# Patient Record
Sex: Male | Born: 1985 | Race: Black or African American | Hispanic: No | Marital: Single | Smoking: Never smoker
Health system: Southern US, Community
[De-identification: ages and names within clinical notes are randomized; demographics above are authoritative.]

## PROBLEM LIST (undated history)

## (undated) DIAGNOSIS — J45909 Unspecified asthma, uncomplicated: Secondary | ICD-10-CM

## (undated) DIAGNOSIS — F1010 Alcohol abuse, uncomplicated: Secondary | ICD-10-CM

## (undated) DIAGNOSIS — M109 Gout, unspecified: Secondary | ICD-10-CM

---
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
## 2005-08-25 ENCOUNTER — Emergency Department (HOSPITAL_COMMUNITY): Admission: EM | Admit: 2005-08-25 | Discharge: 2005-08-25 | Payer: Self-pay | Admitting: Emergency Medicine
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
## 2009-07-26 ENCOUNTER — Emergency Department (HOSPITAL_COMMUNITY): Admission: EM | Admit: 2009-07-26 | Discharge: 2009-07-26 | Payer: Self-pay | Admitting: Emergency Medicine

## 2009-08-03 ENCOUNTER — Emergency Department (HOSPITAL_COMMUNITY): Admission: EM | Admit: 2009-08-03 | Discharge: 2009-08-03 | Payer: Self-pay | Admitting: Emergency Medicine

## 2010-05-18 ENCOUNTER — Emergency Department (HOSPITAL_COMMUNITY): Admission: EM | Admit: 2010-05-18 | Discharge: 2010-05-18 | Payer: Self-pay | Admitting: Emergency Medicine

## 2010-12-11 LAB — HERPES SIMPLEX VIRUS CULTURE: Culture: DETECTED

## 2013-12-09 ENCOUNTER — Encounter (HOSPITAL_COMMUNITY): Payer: Self-pay | Admitting: Emergency Medicine

## 2013-12-09 ENCOUNTER — Emergency Department (HOSPITAL_COMMUNITY)
Admission: EM | Admit: 2013-12-09 | Discharge: 2013-12-09 | Disposition: A | Discharge status: Home / Self Care | Payer: Self-pay | Attending: Emergency Medicine | Admitting: Emergency Medicine

## 2013-12-09 DIAGNOSIS — S61209A Unspecified open wound of unspecified finger without damage to nail, initial encounter: Secondary | ICD-10-CM | POA: Insufficient documentation

## 2013-12-09 DIAGNOSIS — F1010 Alcohol abuse, uncomplicated: Secondary | ICD-10-CM | POA: Insufficient documentation

## 2013-12-09 DIAGNOSIS — S61219A Laceration without foreign body of unspecified finger without damage to nail, initial encounter: Secondary | ICD-10-CM

## 2013-12-09 DIAGNOSIS — Y9389 Activity, other specified: Secondary | ICD-10-CM | POA: Insufficient documentation

## 2013-12-09 DIAGNOSIS — Y92410 Unspecified street and highway as the place of occurrence of the external cause: Secondary | ICD-10-CM | POA: Insufficient documentation

## 2013-12-09 DIAGNOSIS — Z23 Encounter for immunization: Secondary | ICD-10-CM | POA: Insufficient documentation

## 2013-12-09 HISTORY — DX: Alcohol abuse, uncomplicated: F10.10

## 2013-12-09 MED ORDER — TETANUS-DIPHTH-ACELL PERTUSSIS 5-2.5-18.5 LF-MCG/0.5 IM SUSP
0.5000 mL | Freq: Once | INTRAMUSCULAR | Status: AC
  Administered 2013-12-09: 0.5 mL via INTRAMUSCULAR
  Filled 2013-12-09: qty 0.5
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
## 2016-06-14 ENCOUNTER — Emergency Department (HOSPITAL_COMMUNITY)
Admission: EM | Admit: 2016-06-14 | Discharge: 2016-06-14 | Disposition: A | Discharge status: Home / Self Care | Payer: Self-pay | Attending: Emergency Medicine | Admitting: Emergency Medicine

## 2016-06-14 ENCOUNTER — Emergency Department (HOSPITAL_COMMUNITY): Payer: Self-pay

## 2016-06-14 ENCOUNTER — Encounter (HOSPITAL_COMMUNITY): Payer: Self-pay

## 2016-06-14 DIAGNOSIS — F1290 Cannabis use, unspecified, uncomplicated: Secondary | ICD-10-CM | POA: Insufficient documentation

## 2016-06-14 DIAGNOSIS — M7651 Patellar tendinitis, right knee: Secondary | ICD-10-CM | POA: Insufficient documentation

## 2016-06-14 DIAGNOSIS — F1490 Cocaine use, unspecified, uncomplicated: Secondary | ICD-10-CM | POA: Insufficient documentation

## 2016-06-14 NOTE — ED Triage Notes (Signed)
Pt here with rt knee pain.  Pt denies injury.  Pain x 2 days.

## 2016-06-14 NOTE — ED Provider Notes (Signed)
[facility]-EMERGENCY DEPT Provider Note   CSN: [id number] Arrival date & time: 06/14/16  1413   By signing my name below, I, Avnee Patel, attest that this documentation has been prepared under the direction and in the presence of  [employer], PA-C. Electronically Signed: Clovis Pu, ED Scribe. 06/14/16. 5:05 PM.   History   Chief Complaint Chief Complaint  Patient presents with  . Knee Pain    The history is provided by the patient. No language interpreter was used.   HPI Comments: ROY TOKARZ is a 30 y.o. male, with a hx of Osgood-Schlatter disease, who presents to the Emergency Department complaining of constant, moderate right knee pain onset 2 days ago. Pt notes associated swelling which has been present for ~1 week. He states the pain is exacerbated to the touch and during movement. He notes he has 2 jobs and is always on his feet. Pt denies any recent injuries. Pt states he has had similar symptoms in the past. He has applied Bengay, ice, and tried elevating his leg with no relief. Pt denies any other complaints at this time.  Past Medical History:  Diagnosis Date  . ETOH abuse     There are no active problems to display for this patient.   History reviewed. No pertinent surgical history.   Home Medications    Prior to Admission medications   Not on File    Family History History reviewed. No pertinent family history.  Social History Social History  Substance Use Topics  . Smoking status: Never Smoker  . Smokeless tobacco: Never Used  . Alcohol use 4.2 oz/week    5 Cans of beer, 2 Shots of liquor per week     Allergies   Review of patient's allergies indicates no known allergies.   Review of Systems Review of Systems 10 systems reviewed and all are negative for acute change except as noted in the HPI.  Physical Exam Updated Vital Signs BP 141/83 (BP Location: Left Arm)   Pulse 61   Temp 98.6 F (37 C) (Oral)   Resp 18   SpO2 100%    Physical Exam  Constitutional: He appears well-developed and well-nourished. No distress.  HENT:  Head: Normocephalic and atraumatic.  Neck: Neck supple.  Pulmonary/Chest: Effort normal.  Musculoskeletal:  Moderate amount of anterior knee swelling, no redness or warmth to touch. Tenderness to palpation of the inferior patellar ligament and tibial tuberosity full active range of motion.  Neurological: He is alert.  Skin: He is not diaphoretic.  Nursing note and vitals reviewed.    ED Treatments / Results  DIAGNOSTIC STUDIES:  Oxygen Saturation is 100% on RA, normal by my interpretation.    COORDINATION OF CARE:  4:44 PM Discussed treatment plan with pt at bedside and pt agreed to plan.  Labs (all labs ordered are listed, but only abnormal results are displayed) Labs Reviewed - No data to display  EKG  EKG Interpretation None       Radiology Dg Knee Complete 4 Views Right  Result Date: 06/14/2016 CLINICAL DATA:  Right knee pain for 3 days, no known injury, initial encounter EXAM: RIGHT KNEE - COMPLETE 4+ VIEW COMPARISON:  None FINDINGS: No acute fracture or dislocation is noted. Some thickening of the infrapatellar ligament is seen which may be related to chronic injury. Increased density in the infrapatellar fat pad is also noted which may be related to some internal derangement. IMPRESSION: No acute fracture. There are changes suggestive of prior injury  with thickening of the infrapatellar ligament and increased density in the infrapatellar fat pad. Electronically Signed   By: Alcide CleverMark  Lukens M.D.   On: 06/14/2016 15:55    Procedures Procedures (including critical care time)  Medications Ordered in ED Medications - No data to display   Initial Impression / Assessment and Plan / ED Course  I have reviewed the triage vital signs and the nursing notes.  Pertinent labs & imaging results that were available during my care of the patient were reviewed by me and considered  in my medical decision making (see chart for details).  Clinical Course    Labs:   Imaging:   Consults:   Therapeutics:   Discharge Meds:  Assessment/Plan:  30 year old male presents today with likely patellar tendinitis. He has no signs of infectious etiology on exam. Full active range of motion. Patient works 2 jobs likely attributed today's presentation. He was instructed to rest, ice, ibuprofen, follow up with orthopedics if symptoms persist.   Final Clinical Impressions(s) / ED Diagnoses   Final diagnoses:  Patellar tendinitis of right knee    New Prescriptions There are no discharge medications for this patient. I personally performed the services described in this documentation, which was scribed in my presence. The recorded information has been reviewed and is accurate.   Eyvonne MechanicJeffrey Cobie Marcoux, PA-C 06/14/16 1727    Laurence Spatesachel Morgan Little, MD 06/15/16 [id number]

## 2016-06-14 NOTE — Discharge Instructions (Signed)
Please use ice, ibuprofen, rest. Please return to emergency room if any new or worsening signs or symptoms present. Please follow-up with orthopedic specialist if symptoms continue to persist.

## 2017-01-17 ENCOUNTER — Emergency Department (HOSPITAL_COMMUNITY)
Admission: EM | Admit: 2017-01-17 | Discharge: 2017-01-17 | Disposition: A | Discharge status: Home / Self Care | Payer: Self-pay | Attending: Emergency Medicine | Admitting: Emergency Medicine

## 2017-01-17 ENCOUNTER — Encounter (HOSPITAL_COMMUNITY): Payer: Self-pay | Admitting: *Deleted

## 2017-01-17 ENCOUNTER — Emergency Department (HOSPITAL_COMMUNITY): Payer: Self-pay

## 2017-01-17 DIAGNOSIS — S63502A Unspecified sprain of left wrist, initial encounter: Secondary | ICD-10-CM | POA: Insufficient documentation

## 2017-01-17 DIAGNOSIS — X509XXA Other and unspecified overexertion or strenuous movements or postures, initial encounter: Secondary | ICD-10-CM | POA: Insufficient documentation

## 2017-01-17 DIAGNOSIS — Y9289 Other specified places as the place of occurrence of the external cause: Secondary | ICD-10-CM | POA: Insufficient documentation

## 2017-01-17 DIAGNOSIS — Y9389 Activity, other specified: Secondary | ICD-10-CM | POA: Insufficient documentation

## 2017-01-17 DIAGNOSIS — Y990 Civilian activity done for income or pay: Secondary | ICD-10-CM | POA: Insufficient documentation

## 2017-01-17 MED ORDER — NAPROXEN 500 MG PO TABS: 500.0000 mg | ORAL_TABLET | Freq: Two times a day (BID) | ORAL | 0 refills | Status: DC

## 2017-01-17 NOTE — ED Notes (Signed)
See md assessment 

## 2017-01-17 NOTE — ED Triage Notes (Signed)
Pt states that he has been having left wrist pain for 3 days. Pt unsure of any injury. Reports minimal swelling.
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
## 2018-09-05 ENCOUNTER — Other Ambulatory Visit: Payer: Self-pay

## 2018-09-05 ENCOUNTER — Ambulatory Visit (HOSPITAL_COMMUNITY)
Admission: EM | Admit: 2018-09-05 | Discharge: 2018-09-05 | Disposition: A | Discharge status: Home / Self Care | Payer: 59 | Attending: Family Medicine | Admitting: Family Medicine

## 2018-09-05 ENCOUNTER — Encounter (HOSPITAL_COMMUNITY): Payer: Self-pay | Admitting: Emergency Medicine

## 2018-09-05 DIAGNOSIS — R69 Illness, unspecified: Secondary | ICD-10-CM | POA: Diagnosis not present

## 2018-09-05 DIAGNOSIS — J111 Influenza due to unidentified influenza virus with other respiratory manifestations: Secondary | ICD-10-CM | POA: Diagnosis not present

## 2018-09-05 DIAGNOSIS — R509 Fever, unspecified: Secondary | ICD-10-CM | POA: Diagnosis present

## 2018-09-05 LAB — POCT RAPID STREP A: STREPTOCOCCUS, GROUP A SCREEN (DIRECT): NEGATIVE

## 2018-09-05 MED ORDER — ACETAMINOPHEN 325 MG PO TABS
ORAL_TABLET | ORAL | Status: AC
Start: 2018-09-05 — End: 2018-09-05
  Filled 2018-09-05: qty 2

## 2018-09-05 MED ORDER — NAPROXEN 500 MG PO TABS: 500.0000 mg | ORAL_TABLET | Freq: Two times a day (BID) | ORAL | 0 refills | Status: DC

## 2018-09-05 MED ORDER — ACETAMINOPHEN 325 MG PO TABS
650.0000 mg | ORAL_TABLET | Freq: Once | ORAL | Status: AC
  Administered 2018-09-05: 650 mg via ORAL

## 2018-09-05 NOTE — ED Provider Notes (Signed)
09/05/2018 3:32 PM   DOB: [birth year] / MRN: [id number]  SUBJECTIVE:  John Meadows is a 32 y.o. male presenting for fever body aches that started about 2 days ago ago.  Assoicates cough and headache.  Denies shortness of breath, chest pain, leg swelling.  Has tried over-the-counter TheraFlu.   He has No Known Allergies.   He  has a past medical history of ETOH abuse.    He  reports that he has never smoked. He has never used smokeless tobacco. He reports that he drinks about 7.0 standard drinks of alcohol per week. He reports that he has current or past drug history. Drugs: Marijuana and Cocaine. He  has no sexual activity history on file. The patient  has no past surgical history on file.  His family history is not on file.  ROS per HPI  OBJECTIVE:  BP (!) 140/92   Pulse (!) 106   Temp (!) 102 F (38.9 C) (Oral)   Resp 16   Wt 200 lb (90.7 kg)   SpO2 98%   Wt Readings from Last 3 Encounters:  09/05/18 200 lb (90.7 kg)   Temp Readings from Last 3 Encounters:  09/05/18 (!) 102 F (38.9 C) (Oral)  01/17/17 98.4 F (36.9 C) (Oral)  06/14/16 98.6 F (37 C) (Oral)   BP Readings from Last 3 Encounters:  09/05/18 (!) 140/92  01/17/17 (!) 139/105  06/14/16 141/83   Pulse Readings from Last 3 Encounters:  09/05/18 (!) 106  01/17/17 62  06/14/16 61    Physical Exam  Constitutional: He is oriented to person, place, and time. He appears well-developed.  Non-toxic appearance. He does not appear ill.  Eyes: Pupils are equal, round, and reactive to light. Conjunctivae and EOM are normal.  Cardiovascular: Regular rhythm, S1 normal, S2 normal, normal heart sounds, intact distal pulses and normal pulses. Exam reveals no gallop and no friction rub.  No murmur heard. Pulmonary/Chest: Effort normal. No stridor. No respiratory distress. He has no wheezes. He has no rales.  Abdominal: He exhibits no distension.  Musculoskeletal: Normal range of motion. He exhibits no edema.   Neurological: He is alert and oriented to person, place, and time. No cranial nerve deficit. Coordination normal.  Skin: Skin is warm. He is diaphoretic.  Psychiatric: He has a normal mood and affect.  Nursing note and vitals reviewed.   Results for orders placed or performed during the hospital encounter of 09/05/18 (from the past 72 hour(s))  POCT rapid strep A [facility]     Status: None   Collection Time: 09/05/18  3:21 PM  Result Value Ref Range   Streptococcus, Group A Screen (Direct) NEGATIVE NEGATIVE    No results found.  ASSESSMENT AND PLAN:   Influenza-like illness    Discharge Instructions     Go home and get plenty of rest drink lots of fluids.  Please take the NSAID as I prescribed.  Please keep away from small children in the elderly until you are fever free for 48 hours.        The patient is advised to call or return to clinic if he does not see an improvement in symptoms, or to seek the care of the closest emergency department if he worsens with the above plan.   Deliah BostonMichael Clark, MHS, PA-C 09/05/2018 3:32 PM   Ofilia Neaslark, Michael L, PA-C 09/05/18 [phone]

## 2018-09-05 NOTE — ED Triage Notes (Signed)
PT reports cough, sweats, congestion, sore throat, and fever that started a few days ago. PT's temp 102 in triage.

## 2018-09-05 NOTE — Discharge Instructions (Signed)
Go home and get plenty of rest drink lots of fluids.  Please take the NSAID as I prescribed.  Please keep away from small children in the elderly until you are fever free for 48 hours.

## 2018-09-08 LAB — CULTURE, GROUP A STREP (THRC)

## 2019-02-21 ENCOUNTER — Other Ambulatory Visit: Payer: Self-pay

## 2019-02-21 ENCOUNTER — Encounter (HOSPITAL_COMMUNITY): Payer: Self-pay | Admitting: Emergency Medicine

## 2019-02-21 ENCOUNTER — Ambulatory Visit (HOSPITAL_COMMUNITY)
Admission: EM | Admit: 2019-02-21 | Discharge: 2019-02-21 | Disposition: A | Discharge status: Home / Self Care | Payer: 59 | Attending: Internal Medicine | Admitting: Internal Medicine

## 2019-02-21 DIAGNOSIS — R0789 Other chest pain: Secondary | ICD-10-CM

## 2019-02-21 MED ORDER — MELOXICAM 7.5 MG PO TABS: 7.5000 mg | ORAL_TABLET | Freq: Every day | ORAL | 0 refills | Status: DC

## 2019-02-21 NOTE — ED Provider Notes (Signed)
[facility]    CSN: [id number] Arrival date & time: 02/21/19  0803     History   Chief Complaint Chief Complaint  Patient presents with  . Muscle Pain    HPI John Meadows is a 33 y.o. male.   John Meadows presents with complaints of left sided chest wall pain, which started 1 week ago. States approximately 1.5 months ago he started doing push ups regularly and has been pushing himself with this. Also lifts 70-80 pounds at work. No specific known injury but had been doing push ups and lifting prior to onset of pain. Pain with any movement of chest wall, even with coughing or deep breathing. Pain with push-ups so hasn't been able to do those anymore. Pain with palpation to the area as well. No left arm numbness , tingling or weakness. No shortness of breath . No palpitations. No fevers. No cough. Denies any previous similar. Has been taking aleve which helps but he has not been taking it consistently. Hasn't taken today. Pain 10/10, hasn't taken any medications today. Without contributing medical history.      ROS per HPI, negative if not otherwise mentioned.      Past Medical History:  Diagnosis Date  . ETOH abuse     There are no active problems to display for this patient.   History reviewed. No pertinent surgical history.     Home Medications    Prior to Admission medications   Medication Sig Start Date End Date Taking? Authorizing Provider  meloxicam (MOBIC) 7.5 MG tablet Take 1 tablet (7.5 mg total) by mouth daily. 02/21/19   Georgetta HaberBurky, Natalie B, NP    Family History History reviewed. No pertinent family history.  Social History Social History   Tobacco Use  . Smoking status: Never Smoker  . Smokeless tobacco: Never Used  Substance Use Topics  . Alcohol use: Yes    Alcohol/week: 7.0 standard drinks    Types: 5 Cans of beer, 2 Shots of liquor per week  . Drug use: Yes    Types: Marijuana, Cocaine     Allergies   Patient has no  known allergies.   Review of Systems Review of Systems   Physical Exam Triage Vital Signs ED Triage Vitals  Enc Vitals Group     BP 02/21/19 0822 (!) 150/99     Pulse Rate 02/21/19 0822 (!) 106     Resp 02/21/19 0822 18     Temp 02/21/19 0822 98.3 F (36.8 C)     Temp Source 02/21/19 0822 Oral     SpO2 02/21/19 0822 100 %     Weight --      Height --      Head Circumference --      Peak Flow --      Pain Score 02/21/19 0820 10     Pain Loc --      Pain Edu? --      Excl. in GC? --    No data found.  Updated Vital Signs BP 129/85 (BP Location: Left Arm)   Pulse (!) 106   Temp 98.3 F (36.8 C) (Oral)   Resp 18   SpO2 100%    Physical Exam Constitutional:      Appearance: He is well-developed.  Cardiovascular:     Rate and Rhythm: Normal rate and regular rhythm.  Pulmonary:     Effort: Pulmonary effort is normal.     Breath sounds: Normal breath sounds.  Chest:  Chest wall: Tenderness present. No deformity, swelling, crepitus or edema.       Comments: Point tenderness to left chest wall to ribs as well as to pectoralis; some pain with movement of left arm; strength equal bilaterally; gross sensation intact to upper extremities  Skin:    General: Skin is warm and dry.  Neurological:     Mental Status: He is alert and oriented to person, place, and time.      UC Treatments / Results  Labs (all labs ordered are listed, but only abnormal results are displayed) Labs Reviewed - No data to display  EKG None  Radiology No results found.  Procedures Procedures (including critical care time)  Medications Ordered in UC Medications - No data to display  Initial Impression / Assessment and Plan / UC Course  I have reviewed the triage vital signs and the nursing notes.  Pertinent labs & imaging results that were available during my care of the patient were reviewed by me and considered in my medical decision making (see chart for details).     Left  chest musculoskeletal pain, reproducible with movement and with palpation. Low risk for ACS and history inconsistent with this. Mild tachycardia on arrival, no shortness of breath , no cough, no hypoxia, no risk factors for PE. nsaids provided. Return precautions provided. Patient verbalized understanding and agreeable to plan.    Final Clinical Impressions(s) / UC Diagnoses   Final diagnoses:  Chest wall pain     Discharge Instructions     Ice application and end of day.  Limit heavy lifting until symptoms have improved.  Meloxicam daily, take with food.  If no improvement or worsening please return or follow up with sports medicine   ED Prescriptions    Medication Sig Dispense Auth. Provider   meloxicam (MOBIC) 7.5 MG tablet Take 1 tablet (7.5 mg total) by mouth daily. 20 tablet Georgetta Haber, NP     Controlled Substance Prescriptions [place] Controlled Substance Registry consulted? Not Applicable   Georgetta Haber, NP 02/21/19 [phone]

## 2019-02-21 NOTE — Discharge Instructions (Signed)
Ice application and end of day.  Limit heavy lifting until symptoms have improved.  Meloxicam daily, take with food.  If no improvement or worsening please return or follow up with sports medicine

## 2019-02-21 NOTE — ED Triage Notes (Signed)
Patient has been working at home for the last month and a half, after not working out in years, patient also does heavy lifting at work.  Patient reports soreness for a week.  Patient reports lifting heavy loads makes pain worse.  Pain with sneezing and coughing

## 2019-02-28 ENCOUNTER — Encounter (HOSPITAL_COMMUNITY): Payer: Self-pay | Admitting: Emergency Medicine

## 2019-02-28 ENCOUNTER — Other Ambulatory Visit: Payer: Self-pay

## 2019-02-28 ENCOUNTER — Emergency Department (HOSPITAL_COMMUNITY)
Admission: EM | Admit: 2019-02-28 | Discharge: 2019-03-01 | Disposition: A | Discharge status: Home / Self Care | Payer: 59 | Attending: Emergency Medicine | Admitting: Emergency Medicine

## 2019-02-28 DIAGNOSIS — J45909 Unspecified asthma, uncomplicated: Secondary | ICD-10-CM | POA: Diagnosis not present

## 2019-02-28 DIAGNOSIS — N179 Acute kidney failure, unspecified: Secondary | ICD-10-CM

## 2019-02-28 DIAGNOSIS — E860 Dehydration: Secondary | ICD-10-CM | POA: Insufficient documentation

## 2019-02-28 DIAGNOSIS — R252 Cramp and spasm: Secondary | ICD-10-CM | POA: Diagnosis present

## 2019-02-28 HISTORY — DX: Unspecified asthma, uncomplicated: J45.909

## 2019-02-28 LAB — COMPREHENSIVE METABOLIC PANEL
ALT: 33 U/L (ref 0–44)
AST: 36 U/L (ref 15–41)
Albumin: 4.9 g/dL (ref 3.5–5.0)
Alkaline Phosphatase: 79 U/L (ref 38–126)
Anion gap: 16 — ABNORMAL HIGH (ref 5–15)
BUN: 19 mg/dL (ref 6–20)
CO2: 23 mmol/L (ref 22–32)
Calcium: 10.8 mg/dL — ABNORMAL HIGH (ref 8.9–10.3)
Chloride: 92 mmol/L — ABNORMAL LOW (ref 98–111)
Creatinine, Ser: 2.84 mg/dL — ABNORMAL HIGH (ref 0.61–1.24)
GFR calc Af Amer: 32 mL/min — ABNORMAL LOW (ref >60)
GFR calc non Af Amer: 28 mL/min — ABNORMAL LOW (ref >60)
Glucose, Bld: 127 mg/dL — ABNORMAL HIGH (ref 70–99)
Potassium: 5.7 mmol/L — ABNORMAL HIGH (ref 3.5–5.1)
Sodium: 131 mmol/L — ABNORMAL LOW (ref 135–145)
Total Bilirubin: 1.6 mg/dL — ABNORMAL HIGH (ref 0.3–1.2)
Total Protein: 9.1 g/dL — ABNORMAL HIGH (ref 6.5–8.1)

## 2019-02-28 LAB — CBC
HCT: 48 % (ref 39.0–52.0)
Hemoglobin: 16.6 g/dL (ref 13.0–17.0)
MCH: 31.9 pg (ref 26.0–34.0)
MCHC: 34.6 g/dL (ref 30.0–36.0)
MCV: 92.3 fL (ref 80.0–100.0)
Platelets: 270 10*3/uL (ref 150–400)
RBC: 5.2 MIL/uL (ref 4.22–5.81)
RDW: 12.4 % (ref 11.5–15.5)
WBC: 13.4 10*3/uL — ABNORMAL HIGH (ref 4.0–10.5)
nRBC: 0 % (ref 0.0–0.2)

## 2019-02-28 LAB — CK: Total CK: 496 U/L — ABNORMAL HIGH (ref 49–397)

## 2019-02-28 MED ORDER — SODIUM CHLORIDE 0.9 % IV BOLUS
1000.0000 mL | Freq: Once | INTRAVENOUS | Status: AC
  Administered 2019-02-28: 1000 mL via INTRAVENOUS

## 2019-02-28 MED ORDER — LACTATED RINGERS IV BOLUS
1000.0000 mL | Freq: Once | INTRAVENOUS | Status: AC
  Administered 2019-02-28: 1000 mL via INTRAVENOUS

## 2019-02-28 NOTE — ED Triage Notes (Signed)
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 Pt states having nothing to eat but some water throughout the day.   T 97.0  CBG 114  BP 150/90 PR 118

## 2019-02-28 NOTE — ED Provider Notes (Signed)
Emergency Department Provider Note   I have reviewed the triage vital signs and the nursing notes.   HISTORY  Chief Complaint No chief complaint on file.   HPI John Meadows is a 33 y.o. male without significant past medical history who presents the emergency department today with muscle cramping.  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  He states he drinks water throughout the day but no Gatorade or Powerade.  He does not eat much throughout the day either.  No alcohol or drugs associated with his symptoms today.  He started having some muscle soreness around 4 5 and get really sweaty and hot.  His chest was around 7 he felt very warm then and was having muscle cramping anytime he flexed his muscles.   His partner picked him up and on the way home he started vomiting so EMS was called.  He declined transport went home and took a cold shower but continued to have muscle cramping.  He states he did have urination a shower which appeared to be yellow but he could not quantify the amount or the actual color of it.  This only time he members having urine today.  Came to be evaluated with the persistent cramping after that.  No recent fevers or cough.  No other associated or modifying symptoms.    Past Medical History:  Diagnosis Date  . Asthma   . ETOH abuse     There are no active problems to display for this patient.   History reviewed. No pertinent surgical history.  Current Outpatient Rx  . Order #: [id number] Class: Normal    Allergies Patient has no known allergies.  History reviewed. No pertinent family history.  Social History Social History   Tobacco Use  . Smoking status: Never Smoker  . Smokeless tobacco: Never Used  Substance Use Topics  . Alcohol use: Yes    Alcohol/week: 7.0 standard drinks    Types: 5 Cans of beer, 2 Shots of liquor per week  . Drug use: Not Currently   Types: Marijuana, Cocaine    Review of Systems  All other systems negative except as documented in the HPI. All pertinent positives and negatives as reviewed in the HPI. ____________________________________________   PHYSICAL EXAM:  VITAL SIGNS: ED Triage Vitals  Enc Vitals Group     BP 02/28/19 2230 (!) 122/98     Pulse Rate 02/28/19 2230 100     Resp 02/28/19 2230 (!) 21     Temp 02/28/19 2214 (!) 97.5 F (36.4 C)     Temp Source 02/28/19 2214 Oral     SpO2 02/28/19 2230 100 %     Weight 02/28/19 2215 200 lb (90.7 kg)     Height 02/28/19 2215 5\' 10"  (1.778 m)    Constitutional: Alert and oriented. Well appearing and in no acute distress. Eyes: Conjunctivae are normal. PERRL. EOMI. Head: Atraumatic. Nose: No congestion/rhinnorhea. Mouth/Throat: Mucous membranes are moist.  Oropharynx non-erythematous. Neck: No stridor.  No meningeal signs.   Cardiovascular: tachycardic rate, regular rhythm. Good peripheral circulation. Grossly normal heart sounds.   Respiratory: Normal respiratory effort.  No retractions. Lungs CTAB. Gastrointestinal: Soft and nontender. No distention.  Musculoskeletal: No lower extremity tenderness nor edema. No gross deformities of extremities. Neurologic:  Normal speech and language. No gross focal neurologic deficits are appreciated.  Skin:  Skin is warm, dry and intact. No rash  noted.   ____________________________________________   LABS (all labs ordered are listed, but only abnormal results are displayed)  Labs Reviewed  COMPREHENSIVE METABOLIC PANEL - Abnormal; Notable for the following components:      Result Value   Sodium 131 (*)    Potassium 5.7 (*)    Chloride 92 (*)    Glucose, Bld 127 (*)    Creatinine, Ser 2.84 (*)    Calcium 10.8 (*)    Total Protein 9.1 (*)    Total Bilirubin 1.6 (*)    GFR calc non Af Amer 28 (*)    GFR calc Af Amer 32 (*)    Anion gap 16 (*)    All other components within normal limits  CBC - Abnormal;  Notable for the following components:   WBC 13.4 (*)    All other components within normal limits  URINALYSIS, ROUTINE W REFLEX MICROSCOPIC - Abnormal; Notable for the following components:   APPearance HAZY (*)    Glucose, UA 150 (*)    Hgb urine dipstick SMALL (*)    Ketones, ur 5 (*)    Bacteria, UA RARE (*)    All other components within normal limits  CK - Abnormal; Notable for the following components:   Total CK 496 (*)    All other components within normal limits  BASIC METABOLIC PANEL - Abnormal; Notable for the following components:   Sodium 134 (*)    Glucose, Bld 107 (*)    Creatinine, Ser 1.88 (*)    GFR calc non Af Amer 46 (*)    GFR calc Af Amer 53 (*)    All other components within normal limits   ____________________________________________  EKG   EKG Interpretation  Date/Time:  Wednesday February 28 2019 22:17:59 EDT Ventricular Rate:  105 PR Interval:    QRS Duration: 84 QT Interval:  336 QTC Calculation: 444 R Axis:   62 Text Interpretation:  Sinus tachycardia Borderline ST elevation, lateral leads No old tracing to compare Confirmed by Marily Memos [phone]) on 02/28/2019 11:11:15 PM       ____________________________________________  RADIOLOGY  No results found.  ____________________________________________   PROCEDURES  Procedure(s) performed:   Procedures   ____________________________________________   INITIAL IMPRESSION / ASSESSMENT AND PLAN / ED COURSE  Patient with acute kidney injury, hyperkalemia and anion gap.  CK slightly elevated but not to the point of consideration of rhabdomyolysis.  Is Artie had some fluids we will continue to hydrate him until he starts making urine and then recheck his kidney function.  If still significantly elevated may need to be admitted for further hydration however if improved will consider discharge home.  Patient with persistent tachycardia and abdominal cramping after 3 liters of fluid. This in  concert with elevated K and AKI, discussed with Dr. Toniann Fail for observation, further hydration. He did not think patient needed to be observed. Suggested rechecking BMP and if not improving, then to call back.   After >4L fluids, HR around 88, still orthostatic positive but symptoms, K and AKI all improved. Will need to follow up as outpatient to ensure kidney function continuing to improve. otherwise per hospitalist previous recommendation, stable for discharge.   Pertinent labs & imaging results that were available during my care of the patient were reviewed by me and considered in my medical decision making (see chart for details).  A medical screening exam was performed and I feel the patient has had an appropriate workup for their chief complaint at  this time and likelihood of emergent condition existing is low. They have been counseled on decision, discharge, follow up and which symptoms necessitate immediate return to the emergency department. They or their family verbally stated understanding and agreement with plan and discharged in stable condition.   ____________________________________________  FINAL CLINICAL IMPRESSION(S) / ED DIAGNOSES  Final diagnoses:  Dehydration  AKI (acute kidney injury) (HCC)     MEDICATIONS GIVEN DURING THIS VISIT:  Medications  sodium chloride 0.9 % bolus 1,000 mL (0 mLs Intravenous Stopped 03/01/19 0157)  lactated ringers bolus 1,000 mL (0 mLs Intravenous Stopped 03/01/19 0357)  lactated ringers bolus 1,000 mL (0 mLs Intravenous Stopped 03/01/19 0556)  insulin aspart (novoLOG) injection 5 Units (5 Units Intravenous Given 03/01/19 0022)  dextrose 50 % solution 50 mL (50 mLs Intravenous Given 03/01/19 0018)     NEW OUTPATIENT MEDICATIONS STARTED DURING THIS VISIT:  Discharge Medication List as of 03/01/2019  5:58 AM      Note:  This note was prepared with assistance of Dragon voice recognition software. Occasional wrong-word or sound-a-like substitutions  may have occurred due to the inherent limitations of voice recognition software.   Adhya Cocco, Barbara Cower, MD 03/01/19 [phone]

## 2019-03-01 LAB — BASIC METABOLIC PANEL
Anion gap: 12 (ref 5–15)
BUN: 15 mg/dL (ref 6–20)
CO2: 22 mmol/L (ref 22–32)
Calcium: 9.5 mg/dL (ref 8.9–10.3)
Chloride: 100 mmol/L (ref 98–111)
Creatinine, Ser: 1.88 mg/dL — ABNORMAL HIGH (ref 0.61–1.24)
GFR calc Af Amer: 53 mL/min — ABNORMAL LOW (ref >60)
GFR calc non Af Amer: 46 mL/min — ABNORMAL LOW (ref >60)
Glucose, Bld: 107 mg/dL — ABNORMAL HIGH (ref 70–99)
Potassium: 4.2 mmol/L (ref 3.5–5.1)
Sodium: 134 mmol/L — ABNORMAL LOW (ref 135–145)

## 2019-03-01 LAB — URINALYSIS, ROUTINE W REFLEX MICROSCOPIC
Bilirubin Urine: NEGATIVE
Glucose, UA: 150 mg/dL — AB
Ketones, ur: 5 mg/dL — AB
Leukocytes,Ua: NEGATIVE
Nitrite: NEGATIVE
Protein, ur: NEGATIVE mg/dL
Specific Gravity, Urine: 1.006 (ref 1.005–1.030)
pH: 5 (ref 5.0–8.0)

## 2019-03-01 MED ORDER — LACTATED RINGERS IV BOLUS
1000.0000 mL | Freq: Once | INTRAVENOUS | Status: AC
  Administered 2019-03-01: 1000 mL via INTRAVENOUS

## 2019-03-01 MED ORDER — INSULIN ASPART 100 UNIT/ML IV SOLN
5.0000 [IU] | Freq: Once | INTRAVENOUS | Status: AC
  Administered 2019-03-01: 5 [IU] via INTRAVENOUS

## 2019-03-01 MED ORDER — DEXTROSE 50 % IV SOLN
50.0000 mL | Freq: Once | INTRAVENOUS | Status: AC
  Administered 2019-03-01: 50 mL via INTRAVENOUS
  Filled 2019-03-01: qty 50

## 2019-03-01 NOTE — ED Notes (Signed)
Discussed discharge instructions with pt. Pt. Verbalized care. Pt to follow up appt. next Thursday

## 2019-03-01 NOTE — ED Notes (Signed)
Dr. Clayborne Dana informed of increasing abdominal pain mid lower abdomen near the belly button region that worsens with movement

## 2019-03-01 NOTE — ED Notes (Signed)
Pt. Ambulated in hall. Pt. Stable on feet. No cramping. No dizziness. No altered gait.  Pt. States being tired.

## 2020-10-17 ENCOUNTER — Other Ambulatory Visit: Payer: Self-pay

## 2020-10-17 ENCOUNTER — Ambulatory Visit (HOSPITAL_COMMUNITY)
Admission: EM | Admit: 2020-10-17 | Discharge: 2020-10-17 | Disposition: A | Discharge status: Home / Self Care | Payer: Self-pay

## 2020-10-17 ENCOUNTER — Encounter (HOSPITAL_COMMUNITY): Payer: Self-pay

## 2020-10-17 DIAGNOSIS — M25561 Pain in right knee: Secondary | ICD-10-CM

## 2020-10-17 DIAGNOSIS — M25571 Pain in right ankle and joints of right foot: Secondary | ICD-10-CM | POA: Insufficient documentation

## 2020-10-17 LAB — BASIC METABOLIC PANEL
Anion gap: 11 (ref 5–15)
BUN: 10 mg/dL (ref 6–20)
CO2: 27 mmol/L (ref 22–32)
Calcium: 10.3 mg/dL (ref 8.9–10.3)
Chloride: 103 mmol/L (ref 98–111)
Creatinine, Ser: 1.03 mg/dL (ref 0.61–1.24)
GFR, Estimated: >60 mL/min (ref >60)
Glucose, Bld: 98 mg/dL (ref 70–99)
Potassium: 4.4 mmol/L (ref 3.5–5.1)
Sodium: 141 mmol/L (ref 135–145)

## 2020-10-17 MED ORDER — MELOXICAM 7.5 MG PO TABS: 7.5000 mg | ORAL_TABLET | Freq: Every day | ORAL | 0 refills | Status: DC | PRN

## 2020-10-17 NOTE — Discharge Instructions (Addendum)
Regular activity can be helpful.  Ice application at the end of the day, as well as elevation.  Use of ankle brace or ACE wrap as needed for swelling or pain.  Meloxicam daily. Don't take additional ibuprofen for aleve. Take with food.  I am rechecking your kidney function here today, check your mychart- I will leave a noted to confirm ok to take the medication I have sent.  Follow up with orthopedics and/or sports medicine if symptoms persist.

## 2020-10-17 NOTE — ED Triage Notes (Signed)
Pt presents with pain and swelling in  right ankle and right knee, on and off x 6 months. Pain worsens with walking, limited ROM on right knee. 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

## 2020-10-17 NOTE — ED Provider Notes (Signed)
[facility]    CSN: [id number] Arrival date & time: 10/17/20  1130      History   Chief Complaint Chief Complaint  Patient presents with   Ankle Pain        Knee Pain    HPI John Meadows is a 35 y.o. male.   John Meadows presents with complaints of intermittent right knee and right ankle pain which has been ongoing for months now, maybe even around 6 months. No known injury. No redness or warmth. 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 Occasionally takes aleve or ibuprofen, as well as applies ice and topical treatments, which do help. Pain is currently minimal, but wanted to seek recommendations for management. Denies history of gout.     ROS per HPI, negative if not otherwise mentioned.      Past Medical History:  Diagnosis Date   Asthma    ETOH abuse     There are no problems to display for this patient.   History reviewed. No pertinent surgical history.     Home Medications    Prior to Admission medications   Medication Sig Start Date End Date Taking? Authorizing Provider  ibuprofen (ADVIL) 200 MG tablet Take 200 mg by mouth every 6 (six) hours as needed.   Yes [provider]  naproxen sodium (ALEVE) 220 MG tablet Take 220 mg by mouth.   Yes [provider]  meloxicam (MOBIC) 7.5 MG tablet Take 1 tablet (7.5 mg total) by mouth daily as needed for pain. 10/17/20   Georgetta Haber, NP    Family History History reviewed. No pertinent family history.  Social History Social History   Tobacco Use   Smoking status: Never Smoker   Smokeless tobacco: Never Used  Substance Use Topics   Alcohol use: Yes    Alcohol/week: 7.0 standard drinks    Types: 5 Cans of beer, 2 Shots of liquor per week   Drug use: Not Currently    Types: Marijuana, Cocaine     Allergies   Patient has no known allergies.   Review of Systems Review of Systems   Physical Exam Triage Vital Signs ED Triage  Vitals  Enc Vitals Group     BP 10/17/20 1308 (!) 144/97     Pulse Rate 10/17/20 1308 92     Resp 10/17/20 1308 19     Temp 10/17/20 1308 98.4 F (36.9 C)     Temp Source 10/17/20 1308 Oral     SpO2 10/17/20 1308 100 %     Weight --      Height --      Head Circumference --      Peak Flow --      Pain Score 10/17/20 1305 10     Pain Loc --      Pain Edu? --      Excl. in GC? --    No data found.  Updated Vital Signs BP (!) 144/97 (BP Location: Right Arm)    Pulse 92    Temp 98.4 F (36.9 C) (Oral)    Resp 19    SpO2 100%   Visual Acuity Right Eye Distance:   Left Eye Distance:   Bilateral Distance:    Right Eye Near:   Left Eye Near:    Bilateral Near:     Physical Exam Constitutional:      Appearance: He is well-developed.  Cardiovascular:     Rate and Rhythm: Normal  rate.  Pulmonary:     Effort: Pulmonary effort is normal.  Musculoskeletal:     Right ankle: Swelling present. Tenderness present over the medial malleolus. Normal range of motion.     Comments: Very mild right ankle swelling noted primarily to medial malleolus with mild tenderness, primarily to soft tissues ; right knee tender at patellar tendon without bony tenderness; no redness or warmth; no knee swelling; mild pain with flexion of knee, no pain with extension; ambulatory without difficulty   Skin:    General: Skin is warm and dry.  Neurological:     Mental Status: He is alert and oriented to person, place, and time.      UC Treatments / Results  Labs (all labs ordered are listed, but only abnormal results are displayed) Labs Reviewed  BASIC METABOLIC PANEL    EKG   Radiology No results found.  Procedures Procedures (including critical care time)  Medications Ordered in UC Medications - No data to display  Initial Impression / Assessment and Plan / UC Course  I have reviewed the triage vital signs and the nursing notes.  Pertinent labs & imaging results that were available  during my care of the patient were reviewed by me and considered in my medical decision making (see chart for details).     Suspect arthritis as source of symptoms, does not necessarily sound or appear consistent with gout. No known injury. Chronic in nature at this point. Encouraged increased activity, quad muscle strengthening, nsaids prn, ortho or sports medicine follow up. Last documented visit with elevated creatinine, so recheck today to ensure back at baseline.  Final Clinical Impressions(s) / UC Diagnoses   Final diagnoses:  Right knee pain, unspecified chronicity  Right ankle pain, unspecified chronicity     Discharge Instructions     Regular activity can be helpful.  Ice application at the end of the day, as well as elevation.  Use of ankle brace or ACE wrap as needed for swelling or pain.  Meloxicam daily. Don't take additional ibuprofen for aleve. Take with food.  I am rechecking your kidney function here today, check your mychart- I will leave a noted to confirm ok to take the medication I have sent.  Follow up with orthopedics and/or sports medicine if symptoms persist.    ED Prescriptions    Medication Sig Dispense Auth. Provider   meloxicam (MOBIC) 7.5 MG tablet Take 1 tablet (7.5 mg total) by mouth daily as needed for pain. 20 tablet Georgetta Haber, NP     PDMP not reviewed this encounter.   Georgetta Haber, NP 10/17/20 1423

## 2021-12-07 ENCOUNTER — Ambulatory Visit (HOSPITAL_COMMUNITY)
Admission: EM | Admit: 2021-12-07 | Discharge: 2021-12-07 | Disposition: A | Discharge status: Home / Self Care | Payer: Self-pay | Attending: Sports Medicine | Admitting: Sports Medicine

## 2021-12-07 ENCOUNTER — Encounter (HOSPITAL_COMMUNITY): Payer: Self-pay | Admitting: *Deleted

## 2021-12-07 ENCOUNTER — Other Ambulatory Visit: Payer: Self-pay

## 2021-12-07 DIAGNOSIS — M25432 Effusion, left wrist: Secondary | ICD-10-CM

## 2021-12-07 DIAGNOSIS — M25532 Pain in left wrist: Secondary | ICD-10-CM

## 2021-12-07 MED ORDER — PREDNISONE 10 MG (21) PO TBPK: ORAL_TABLET | Freq: Every day | ORAL | 0 refills | Status: AC

## 2021-12-07 NOTE — Discharge Instructions (Addendum)
Apply ice for 15-20 minutes at least 3 times daily ?Would take the next 2-3 days to rest the wrist and try to minimize motion ?When he returned to work, would recommend wearing the brace with activity, he may take this off at home or at nighttime ? ?Complete your prednisone pack over the next 6 days, taper down each day until completion.  Do not take Aleve or ibuprofen during this time, but you may take this following the completion of your prednisone pack ? ?Would recommend following up with the sports medicine center if your wrist pain recurs or returns.  Fever or chills, redness or worsening swelling develops, would recommend further evaluation immediately in the emergency room or urgent care. ?

## 2021-12-07 NOTE — ED Triage Notes (Signed)
Pt reports Lt wrist pain and swelling. Pt arrived with wrist splint on. ?

## 2021-12-07 NOTE — ED Provider Notes (Signed)
?[facility] ? ? ? ?CSN: [id number] ?Arrival date & time: 12/07/21  1034 ? ? ?  ? ?History   ?Chief Complaint ?Chief Complaint  ?Patient presents with  ? Wrist Pain  ? ? ?HPI ?John Meadows is a 36 y.o. male here for left wrist pain. ? ? ?Wrist Pain ? ? ?Patient presents with insidious onset of left wrist pain.  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  He has been working a lot recently over the last month, working overtime frequently.  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  Over the last month he has had some pain and now some swelling in the wrist.  No injury that he knows of.  It is painful to grab things.  Pain is worse with moving the wrist in any direction.  He denies any redness, erythema, although does note swelling about the wrist.  Has happened to him before and he has had an x-ray of the wrist back in 2018 and they told him he had some arthritis.  He is wearing a wrist splint for the wrist which helps to a mild degree.  He has been taking Aleve, Biofreeze, Ace bandages and wearing the brace without much relief at this point.  ? ?He states his mother and father have arthritis, but denies any rheumatoid, psoriatic or other rheumatologic conditions in his family or personally that he is aware of. ? ?Meds: Biofreeze, ace bandages, aleve, keeping brace on it.  ? ?Past Medical History:  ?Diagnosis Date  ? Asthma   ? ETOH abuse   ? ? ?There are no problems to display for this patient. ? ? ?History reviewed. No pertinent surgical history. ? ? ? ? ?Home Medications   ? ?Prior to Admission medications   ?Medication Sig Start Date End Date Taking? Authorizing Provider  ?predniSONE (STERAPRED UNI-PAK 21 TAB) 10 MG (21) TBPK tablet Take by mouth daily for 6 days. Take 6 tabs by mouth daily for 1 day, then 5 tabs for 1 day, then 4 tabs for 1 day, then 3 tabs for 1 day, 2 tabs for 1 day, then 1 tab by mouth daily for 1 day. Completed. 12/07/21 12/13/21  Yes Madelyn BrunnerBrooks, Deliah Strehlow, DO  ?ibuprofen (ADVIL) 200 MG tablet Take 200 mg by mouth every 6 (six) hours as needed.    [provider]  ?meloxicam (MOBIC) 7.5 MG tablet Take 1 tablet (7.5 mg total) by mouth daily as needed for pain. 10/17/20   Georgetta HaberBurky, Natalie B, NP  ?naproxen sodium (ALEVE) 220 MG tablet Take 220 mg by mouth.    [provider]  ? ? ?Family History ?History reviewed. No pertinent family history. ? ?Social History ?Social History  ? ?Tobacco Use  ? Smoking status: Never  ? Smokeless tobacco: Never  ?Substance Use Topics  ? Alcohol use: Yes  ?  Alcohol/week: 7.0 standard drinks  ?  Types: 5 Cans of beer, 2 Shots of liquor per week  ? Drug use: Not Currently  ?  Types: Marijuana, Cocaine  ? ? ? ?Allergies   ?Patient has no known allergies. ? ? ?Review of Systems ?Review of Systems  ?Constitutional:  Negative for chills and fever.  ?Musculoskeletal:  Positive for arthralgias (left wrist pain) and joint swelling (left wrist).  ?Neurological:  Negative for weakness.  ? ? ?Physical Exam ?Triage Vital Signs ?ED Triage Vitals  ?Enc Vitals Group  ?  BP 12/07/21 1246 131/78  ?   Pulse Rate 12/07/21 1246 80  ?   Resp 12/07/21 1246 18  ?   Temp 12/07/21 1246 97.9 ?F (36.6 ?C)  ?   Temp src --   ?   SpO2 12/07/21 1246 100 %  ?   Weight --   ?   Height --   ?   Head Circumference --   ?   Peak Flow --   ?   Pain Score 12/07/21 1243 10  ?   Pain Loc --   ?   Pain Edu? --   ?   Excl. in [place]? --   ? ?No data found. ? ?Updated Vital Signs ?BP 131/78   Pulse 80   Temp 97.9 ?F (36.6 ?C)   Resp 18   SpO2 100%  ? ?Physical Exam ?Gen: Well-appearing, in no acute distress; non-toxic ?CV: Regular Rate. Well-perfused. Warm.  ?Resp: Breathing unlabored on room air; no wheezing. ?Psych: Fluid speech in conversation; appropriate affect; normal thought process ?Neuro: Sensation intact throughout. No gross coordination deficits.  ?MSK:  ?- Left wrist: Positive TTP noted on the ulnar side of the wrist just lateral to the  ulnar styloid.  There is no bony tenderness.  Inspection yields swelling of the dorsal aspect of the wrist into the mid hand.  No erythema, ecchymosis.  Range of motion is limited in all directions.  His pain is worse with extension and ulnar deviation.  He is able to squeeze my fingers with full grip strength, although painful.  Full strength about the wrist is difficult to appreciate secondary to pain. + ECU Synergy test.  Negative fovea sign.  Manger of provocative testing unable to be performed secondary to pain.  Neurovascular intact distally. ? ?UC Treatments / Results  ?Labs ?(all labs ordered are listed, but only abnormal results are displayed) ?Labs Reviewed - No data to display ? ?EKG ? ? ?Radiology ?No results found. ? ?Procedures ?Procedures (including critical care time) ? ?Medications Ordered in UC ?Medications - No data to display ? ?Initial Impression / Assessment and Plan / UC Course  ?I have reviewed the triage vital signs and the nursing notes. ? ?Pertinent labs & imaging results that were available during my care of the patient were reviewed by me and considered in my medical decision making (see chart for details). ? ?  ? ?Patient presents with acute on chronic left wrist pain and swelling.  This does seem most likely due to an overuse injury given the degree and frequency of his work.  However, he does appear to have swelling of the wrist, there is some soft tissue swelling, but it does appear there is some swelling within the joint of the wrist.  X-rays from 2018 personally reviewed without significant degenerative changes, neutral ulnar variance.  Lower suspicion for rheumatologic condition, however cannot rule out.  Patient is more so interested in getting back to work and with treatment.  We will treat with prednisone 6-day taper course.  He may ice, rest and immobilized over the next couple days until improving.  He may return into the wrist brace and limit provocative motions at work as  possible.  Did provide information for local sports medicine center to follow-up for more long-term management.  There are no signs of infection today, although return precautions provided for patient for any redness, fever or chills, or other warning symptoms.  He is agreeable and understanding.  Safe for discharge home. ?Final  Clinical Impressions(s) / UC Diagnoses  ? ?Final diagnoses:  ?Acute pain of left wrist  ?Wrist swelling, left  ? ? ? ?Discharge Instructions   ? ?  ?Apply ice for 15-20 minutes at least 3 times daily ?Would take the next 2-3 days to rest the wrist and try to minimize motion ?When he returned to work, would recommend wearing the brace with activity, he may take this off at home or at nighttime ? ?Complete your prednisone pack over the next 6 days, taper down each day until completion.  Do not take Aleve or ibuprofen during this time, but you may take this following the completion of your prednisone pack ? ?Would recommend following up with the sports medicine center if your wrist pain recurs or returns.  Fever or chills, redness or worsening swelling develops, would recommend further evaluation immediately in the emergency room or urgent care. ? ? ? ? ?ED Prescriptions   ? ? Medication Sig Dispense Auth. Provider  ? predniSONE (STERAPRED UNI-PAK 21 TAB) 10 MG (21) TBPK tablet Take by mouth daily for 6 days. Take 6 tabs by mouth daily for 1 day, then 5 tabs for 1 day, then 4 tabs for 1 day, then 3 tabs for 1 day, 2 tabs for 1 day, then 1 tab by mouth daily for 1 day. Completed. 21 tablet Madelyn Brunner, DO  ? ?  ? ?PDMP not reviewed this encounter. ?  Madelyn Brunner, DO ?12/07/21 1330 ? ?

## 2022-04-17 ENCOUNTER — Ambulatory Visit (HOSPITAL_COMMUNITY)
Admission: EM | Admit: 2022-04-17 | Discharge: 2022-04-17 | Disposition: A | Discharge status: Home / Self Care | Payer: Self-pay | Attending: Internal Medicine | Admitting: Internal Medicine

## 2022-04-17 ENCOUNTER — Encounter (HOSPITAL_COMMUNITY): Payer: Self-pay | Admitting: Emergency Medicine

## 2022-04-17 DIAGNOSIS — M109 Gout, unspecified: Secondary | ICD-10-CM | POA: Insufficient documentation

## 2022-04-17 DIAGNOSIS — M7989 Other specified soft tissue disorders: Secondary | ICD-10-CM | POA: Insufficient documentation

## 2022-04-17 LAB — SEDIMENTATION RATE: Sed Rate: 40 mm/hr — ABNORMAL HIGH (ref 0–16)

## 2022-04-17 LAB — URIC ACID: Uric Acid, Serum: 9.1 mg/dL — ABNORMAL HIGH (ref 3.7–8.6)

## 2022-04-17 MED ORDER — COLCHICINE 0.6 MG PO TABS: ORAL_TABLET | ORAL | 0 refills | Status: DC

## 2022-04-17 MED ORDER — METHYLPREDNISOLONE SODIUM SUCC 125 MG IJ SOLR
INTRAMUSCULAR | Status: AC
  Filled 2022-04-17: qty 2

## 2022-04-17 MED ORDER — METHYLPREDNISOLONE SODIUM SUCC 125 MG IJ SOLR
80.0000 mg | Freq: Once | INTRAMUSCULAR | Status: AC
  Administered 2022-04-17: 80 mg via INTRAMUSCULAR

## 2022-04-17 NOTE — ED Triage Notes (Signed)
Noticed right foot swelling after working Wednesday night. Has been using compression, epsom salt, and ibuprofen to try and manage the pain and swelling without improvement. Foot appears red, warm, and swollen. Small scabbed abrasion visible to medial side of foot. Denies diabetic history. Says people have told him it was gout in the past, but he's unsure that's correct. Also unsure if the same thing has happened in the past.

## 2022-04-17 NOTE — ED Provider Notes (Signed)
[place]    CSN: [id number] Arrival date & time: 04/17/22  1049      History   Chief Complaint Chief Complaint  Patient presents with   Foot Pain    HPI John Meadows is a 36 y.o. male.   Patient presents to urgent care for evaluation of his right foot swelling that he first started on Wednesday, April 14, 2022 but states that he believes the swelling started 2 days before he first noticed its significance.  Patient denies injury/trauma to the right foot and denies recent falls.  Patient has been using ibuprofen, Epsom salt soaks, ice, and elevation and attempted to reduce the swelling with some relief.  He states that the ibuprofen has taken the edge off of the pain but that the pain is still very significant.  Pain to the right foot is worse with walking and pressure.  He is attempted use of an ankle compression sleeve to reduce some of the swelling without any relief.  Swelling is mostly to the dorsal aspect of the distal right foot.  Patient denies numbness and tingling to the bilateral lower extremities.  States that his right foot was much more swollen, red, and warm a couple of days ago before he began to perform the Epsom salt soaks and before he began use of ice.  Patient states that this is happened in the past and he was diagnosed with possible gout but he is unsure if this diagnosis was accurate.  Patient drinks approximately 1 beer per day but states that a couple of years ago he was drinking significantly more than this.  Patient eats a lot of fish and does not eat red meat.  He attempts to drink plenty of water to stay well-hydrated.  He denies diagnosis of diabetes and kidney issues.  He does not have insurance at this time and does not have a primary care provider but states that he is going to attempt to gain insurance through his workplace and schedule an appointment with a primary care provider for follow-up and general health maintenance.  Denies any changes  in medications recently and chronic medical problems.    Foot Pain    Past Medical History:  Diagnosis Date   Asthma    ETOH abuse     There are no problems to display for this patient.   History reviewed. No pertinent surgical history.     Home Medications    Prior to Admission medications   Medication Sig Start Date End Date Taking? Authorizing Provider  colchicine 0.6 MG tablet Take colchicine 1.2 mg (2 pills) when you pick up your prescription.  You may take 1 more pill (0.6 mg) 1 hour after you take your first dose.  Tomorrow, start taking 1 pill of colchicine in the morning (0.6 mg) and 1 pill in the evening (0.6 mg) with food for 5 days to fully treat your gout flare. 04/17/22  Yes Talbot Grumbling, FNP  naproxen sodium (ALEVE) 220 MG tablet Take 220 mg by mouth.    [provider]    Family History History reviewed. No pertinent family history.  Social History Social History   Tobacco Use   Smoking status: Never   Smokeless tobacco: Never  Substance Use Topics   Alcohol use: Yes    Alcohol/week: 7.0 standard drinks of alcohol    Types: 5 Cans of beer, 2 Shots of liquor per week   Drug use: Not Currently    Types:  Marijuana, Cocaine     Allergies   Patient has no known allergies.   Review of Systems Review of Systems Per HPI  Physical Exam Triage Vital Signs ED Triage Vitals [04/17/22 1115]  Enc Vitals Group     BP (!) 163/94     Pulse Rate 68     Resp 16     Temp 98.2 F (36.8 C)     Temp Source Oral     SpO2 95 %     Weight      Height      Head Circumference      Peak Flow      Pain Score      Pain Loc      Pain Edu?      Excl. in [place]?    No data found.  Updated Vital Signs BP (!) 163/94 (BP Location: Right Arm)   Pulse 68   Temp 98.2 F (36.8 C) (Oral)   Resp 16   SpO2 95%   Visual Acuity Right Eye Distance:   Left Eye Distance:   Bilateral Distance:    Right Eye Near:   Left Eye Near:    Bilateral  Near:     Physical Exam Vitals and nursing note reviewed.  Constitutional:      Appearance: Normal appearance. He is not ill-appearing or toxic-appearing.     Comments: Very pleasant patient sitting on exam in position of comfort table in no acute distress.   HENT:     Head: Normocephalic and atraumatic.     Right Ear: Hearing and external ear normal.     Left Ear: Hearing and external ear normal.     Nose: Nose normal.     Mouth/Throat:     Lips: Pink.     Mouth: Mucous membranes are moist.  Eyes:     General: Lids are normal. Vision grossly intact. Gaze aligned appropriately.     Extraocular Movements: Extraocular movements intact.     Conjunctiva/sclera: Conjunctivae normal.  Pulmonary:     Effort: Pulmonary effort is normal.  Abdominal:     Palpations: Abdomen is soft.  Musculoskeletal:        General: Swelling present.     Cervical back: Neck supple.     Comments: Right foot: The dorsal aspect of the right foot appears to be very swollen and mildly warm to the touch.  There is some mild erythema to the medial aspect of the right foot as well.  +2 anterior tibial pulse palpated.  Capillary refill to the right foot is less than 3 and sensation is intact distally.  Full range of motion present to the right foot without limitation due to tenderness.  Patient is able to ambulate with a steady gait and a mild limp due to the pain.   There is no swelling to the great toe joints or the ankle joints.  Patient has full range of motion of the digits of the right foot.  Skin:    General: Skin is warm and dry.     Capillary Refill: Capillary refill takes less than 2 seconds.     Findings: No rash.  Neurological:     General: No focal deficit present.     Mental Status: He is alert and oriented to person, place, and time. Mental status is at baseline.     Cranial Nerves: No dysarthria or facial asymmetry.     Gait: Gait is intact.  Psychiatric:  Mood and Affect: Mood normal.         Speech: Speech normal.        Behavior: Behavior normal.        Thought Content: Thought content normal.        Judgment: Judgment normal.      UC Treatments / Results  Labs (all labs ordered are listed, but only abnormal results are displayed) Labs Reviewed  URIC ACID  SEDIMENTATION RATE    EKG   Radiology No results found.  Procedures Procedures (including critical care time)  Medications Ordered in UC Medications  methylPREDNISolone sodium succinate (SOLU-MEDROL) 125 mg/2 mL injection 80 mg (80 mg Intramuscular Given 04/17/22 1154)    Initial Impression / Assessment and Plan / UC Course  I have reviewed the triage vital signs and the nursing notes.  Pertinent labs & imaging results that were available during my care of the patient were reviewed by me and considered in my medical decision making (see chart for details).  1.  Acute gout of right foot Uric acid and ESR labs drawn to support gout diagnosis per patient's request.  Symptomology and physical exam are very consistent with acute gouty arthritis of the right foot.  Plan to treat this with an injection of 80 mg Solu-Medrol in the clinic and colchicine starting today.  Patient to take colchicine as prescribed.  He is to continue use of ice to the right foot.  Ace wrap provided in clinic for compression.  Patient given work note to return to work on Tuesday, April 20, 2022.  Encourage patient to elevate the right lower extremity and continue Epsom salt soaks for symptom relief.  Labs will come back in the next few days and we will call if abnormal.  Patient given information to schedule appointment with PCP via [facility] website.  PCP assistance also initiated today.  Patient to follow-up with urgent care as needed if symptoms do not improve in the next 2 to 3 days or if he develops any new or worsening symptoms.   Discussed physical exam and available lab work findings in clinic with patient.  Counseled patient regarding  appropriate use of medications and potential side effects for all medications recommended or prescribed today. Discussed red flag signs and symptoms of worsening condition,when to call the PCP office, return to urgent care, and when to seek higher level of care in the emergency department. Patient verbalizes understanding and agreement with plan. All questions answered. Patient discharged in stable condition.  Final Clinical Impressions(s) / UC Diagnoses   Final diagnoses:  Acute gout of right foot, unspecified cause  Foot swelling     Discharge Instructions      You have gout to your right foot most likely. We have drawn lab work today to support this clinical diagnosis.  We gave you an injection of steroid in the clinic to decrease the inflammation in your right foot. You have done a great job at home to keep this at [place], but I believe we do need prescription medicines at this time to help with your gout flare.  Do not use any more ibuprofen until you are done with your colchicine prescription.  Take colchicine 1.2 mg (2 pills) when you pick up your prescription.  You may take 1 more pill (0.6 mg) 1 hour after you take your first dose.  Tomorrow, start taking 1 pill of colchicine in the morning (0.6 mg) and 1 pill in the evening (0.6 mg) with food for  5 days to fully treat your gout flare.  Read the information in your packet regarding low purine eating plan to attempt to prevent gout in the future. Continue drinking plenty of water and limit the amount of alcohol you drink.  To find a primary care provider go to [email or web address] and follow the prompts to schedule a new patient appointment for primary care.  Someone from [facility] health will be reaching out to you either via MyChart or phone call to help you set up a primary care provider appointment as well.  It is very important to have a primary care provider to manage your overall health and prevent/manage chronic medical  conditions.   If you develop any new or worsening symptoms or do not improve in the next 2 to 3 days, please return.  If your symptoms are severe, please go to the emergency room.  Follow-up with your primary care provider for further evaluation and management of your symptoms as well as ongoing wellness visits.  I hope you feel better!    ED Prescriptions     Medication Sig Dispense Auth. Provider   colchicine 0.6 MG tablet Take colchicine 1.2 mg (2 pills) when you pick up your prescription.  You may take 1 more pill (0.6 mg) 1 hour after you take your first dose.  Tomorrow, start taking 1 pill of colchicine in the morning (0.6 mg) and 1 pill in the evening (0.6 mg) with food for 5 days to fully treat your gout flare. 12 tablet Talbot Grumbling, FNP      PDMP not reviewed this encounter.   Talbot Grumbling, [place] 04/17/22 1232

## 2022-04-17 NOTE — Discharge Instructions (Addendum)
You have gout to your right foot most likely. We have drawn lab work today to support this clinical diagnosis.  We gave you an injection of steroid in the clinic to decrease the inflammation in your right foot. You have done a great job at home to keep this at bay, but I believe we do need prescription medicines at this time to help with your gout flare.  Do not use any more ibuprofen until you are done with your colchicine prescription.  Take colchicine 1.2 mg (2 pills) when you pick up your prescription.  You may take 1 more pill (0.6 mg) 1 hour after you take your first dose.  Tomorrow, start taking 1 pill of colchicine in the morning (0.6 mg) and 1 pill in the evening (0.6 mg) with food for 5 days to fully treat your gout flare.  Read the information in your packet regarding low purine eating plan to attempt to prevent gout in the future. Continue drinking plenty of water and limit the amount of alcohol you drink.  To find a primary care provider go to [email or web address] and follow the prompts to schedule a new patient appointment for primary care.  Someone from [facility] health will be reaching out to you either via MyChart or phone call to help you set up a primary care provider appointment as well.  It is very important to have a primary care provider to manage your overall health and prevent/manage chronic medical conditions.   If you develop any new or worsening symptoms or do not improve in the next 2 to 3 days, please return.  If your symptoms are severe, please go to the emergency room.  Follow-up with your primary care provider for further evaluation and management of your symptoms as well as ongoing wellness visits.  I hope you feel better!

## 2022-05-05 ENCOUNTER — Ambulatory Visit (HOSPITAL_COMMUNITY)
Admission: EM | Admit: 2022-05-05 | Discharge: 2022-05-05 | Disposition: A | Discharge status: Home / Self Care | Payer: Self-pay | Attending: Emergency Medicine | Admitting: Emergency Medicine

## 2022-05-05 ENCOUNTER — Encounter (HOSPITAL_COMMUNITY): Payer: Self-pay

## 2022-05-05 DIAGNOSIS — M109 Gout, unspecified: Secondary | ICD-10-CM | POA: Insufficient documentation

## 2022-05-05 LAB — BASIC METABOLIC PANEL
Anion gap: 9 (ref 5–15)
BUN: 5 mg/dL — ABNORMAL LOW (ref 6–20)
CO2: 29 mmol/L (ref 22–32)
Calcium: 10.1 mg/dL (ref 8.9–10.3)
Chloride: 101 mmol/L (ref 98–111)
Creatinine, Ser: 0.94 mg/dL (ref 0.61–1.24)
GFR, Estimated: >60 mL/min (ref >60)
Glucose, Bld: 97 mg/dL (ref 70–99)
Potassium: 4.2 mmol/L (ref 3.5–5.1)
Sodium: 139 mmol/L (ref 135–145)

## 2022-05-05 LAB — URIC ACID: Uric Acid, Serum: 4.8 mg/dL (ref 3.7–8.6)

## 2022-05-05 MED ORDER — PREDNISONE 10 MG PO TABS: ORAL_TABLET | ORAL | 0 refills | Status: AC

## 2022-05-05 MED ORDER — TRIAMCINOLONE ACETONIDE 40 MG/ML IJ SUSP
40.0000 mg | Freq: Once | INTRAMUSCULAR | Status: AC
  Administered 2022-05-05: 40 mg via INTRAMUSCULAR

## 2022-05-05 MED ORDER — TRIAMCINOLONE ACETONIDE 40 MG/ML IJ SUSP
INTRAMUSCULAR | Status: AC
  Filled 2022-05-05: qty 1

## 2022-05-05 MED ORDER — TRIAMCINOLONE ACETONIDE 40 MG/ML IJ SUSP: 80.0000 mg | Freq: Once | INTRAMUSCULAR | Status: DC

## 2022-05-05 NOTE — ED Provider Notes (Signed)
[facility]    CSN: [id number] Arrival date & time: 05/05/22  1357    HISTORY   Chief Complaint  Patient presents with   Gout   HPI John Meadows is a pleasant, 36 y.o. male who presents to urgent care today. Pt c/o gout to top of both feet since Sunday. States had a beer on Friday.  Patient was treated urgent care for gout flare on April 17, 2022 with colchicine.  EMR reviewed, patient has a history of acute kidney failure, last BMP was in 2022 and was normal.  Patient states his mother and his grandmother both suffer from gout.  Patient states that he is still taking allopurinol at this time.  The history is provided by the patient.   Past Medical History:  Diagnosis Date   Asthma    ETOH abuse    There are no problems to display for this patient.  History reviewed. No pertinent surgical history.  Home Medications    Prior to Admission medications   Medication Sig Start Date End Date Taking? Authorizing Provider  naproxen sodium (ALEVE) 220 MG tablet Take 220 mg by mouth.    [provider]    Family History History reviewed. No pertinent family history. Social History Social History   Tobacco Use   Smoking status: Never   Smokeless tobacco: Never  Substance Use Topics   Alcohol use: Yes    Alcohol/week: 7.0 standard drinks of alcohol    Types: 5 Cans of beer, 2 Shots of liquor per week   Drug use: Not Currently    Types: Marijuana, Cocaine   Allergies   Patient has no known allergies.  Review of Systems Review of Systems Pertinent findings revealed after performing a 14 point review of systems has been noted in the history of present illness.  Physical Exam Triage Vital Signs ED Triage Vitals  Enc Vitals Group     BP 07/24/21 0827 (!) 147/82     Pulse Rate 07/24/21 0827 72     Resp 07/24/21 0827 18     Temp 07/24/21 0827 98.3 F (36.8 C)     Temp Source 07/24/21 0827 Oral     SpO2 07/24/21 0827 98 %     Weight --       Height --      Head Circumference --      Peak Flow --      Pain Score 07/24/21 0826 5     Pain Loc --      Pain Edu? --      Excl. in [place]? --   No data found.  Updated Vital Signs BP 139/87 (BP Location: Left Arm)   Pulse 89   Temp 98.3 F (36.8 C) (Oral)   Resp 18   SpO2 99%   Physical Exam Vitals and nursing note reviewed.  Constitutional:      General: He is not in acute distress.    Appearance: Normal appearance. He is normal weight. He is not ill-appearing.  HENT:     Head: Normocephalic and atraumatic.  Eyes:     Extraocular Movements: Extraocular movements intact.     Conjunctiva/sclera: Conjunctivae normal.     Pupils: Pupils are equal, round, and reactive to light.  Cardiovascular:     Rate and Rhythm: Normal rate and regular rhythm.  Pulmonary:     Effort: Pulmonary effort is normal.     Breath sounds: Normal breath sounds.  Musculoskeletal:  General: Tenderness (Top of both feet.) present. Normal range of motion.     Cervical back: Normal range of motion and neck supple.     Right lower leg: No edema.     Left lower leg: No edema.  Skin:    General: Skin is warm and dry.  Neurological:     General: No focal deficit present.     Mental Status: He is alert and oriented to person, place, and time. Mental status is at baseline.  Psychiatric:        Mood and Affect: Mood normal.        Behavior: Behavior normal.        Thought Content: Thought content normal.        Judgment: Judgment normal.     Visual Acuity Right Eye Distance:   Left Eye Distance:   Bilateral Distance:    Right Eye Near:   Left Eye Near:    Bilateral Near:     UC Couse / Diagnostics / Procedures:     Radiology No results found.  Procedures Procedures (including critical care time) EKG  Pending results:  Labs Reviewed  BASIC METABOLIC PANEL  URIC ACID    Medications Ordered in UC: Medications  triamcinolone acetonide (KENALOG-40) injection 40 mg (40 mg  Intramuscular Given 05/05/22 1536)    UC Diagnoses / Final Clinical Impressions(s)   I have reviewed the triage vital signs and the nursing notes.  Pertinent labs & imaging results that were available during my care of the patient were reviewed by me and considered in my medical decision making (see chart for details).    Final diagnoses:  Acute gout of foot, unspecified cause, unspecified laterality   Patient advised to stop allopurinol at this time.  Patient advised that since he just took patient seen and recommended we do a long taper of prednisone.  Patient provided with a Kenalog injection during his visit today as well.  Repeat uric acid level performed as well as a BMP.  Will notify patient of results once received and provide further recommendations if any.  Return precautions advised.  ED Prescriptions     Medication Sig Dispense Auth. Provider   predniSONE (DELTASONE) 10 MG tablet Take 4 tablets (40 mg total) by mouth daily with breakfast for 7 days, THEN 3 tablets (30 mg total) daily with breakfast for 3 days, THEN 2 tablets (20 mg total) daily with breakfast for 3 days, THEN 1 tablet (10 mg total) daily with breakfast for 3 days, THEN 0.5 tablets (5 mg total) daily with breakfast for 2 days. 47 tablet Theadora Rama Scales, PA-C      PDMP not reviewed this encounter.  Pending results:  Labs Reviewed  BASIC METABOLIC PANEL  URIC ACID    Discharge Instructions:   Discharge Instructions      For treatment of gout, you are provided with an injection of steroids during your visit today.  Tomorrow morning, please begin taking prednisone 40 mg (four tablets) daily with your morning meal until the acute flare has mostly resolved.  Once this occurs, begin taking the lower doses of prednisone exactly as prescribed.    Your prednisone prescription will provide you with enough prednisone tablets for a 21 day treatment but because you will likely not need to take the 40 mg dose for  the full seven days as I have provided so you will more than likely have several tablets left over.    I have enclosed a list  of foods to avoid to reduce your gout flares.  Also as we discussed, if you suspect that you are beginning to have a gout flare, stop taking allopurinol immediately and reach out to your PCP or Urgent Care for acute gout treatment.   The result of your blood tests will be posted to your mychart account as soon as they are complete, a few days typically.  If there are any abnormal findings that require intervention, you will be contacted by phone.   Thank you for visiting urgent care today.          Disposition Upon Discharge:  Condition: stable for discharge home  Patient presented with an acute illness with associated systemic symptoms and significant discomfort requiring urgent management. In my opinion, this is a condition that a prudent lay person (someone who possesses an average knowledge of health and medicine) may potentially expect to result in complications if not addressed urgently such as respiratory distress, impairment of bodily function or dysfunction of bodily organs.   Routine symptom specific, illness specific and/or disease specific instructions were discussed with the patient and/or caregiver at length.   As such, the patient has been evaluated and assessed, work-up was performed and treatment was provided in alignment with urgent care protocols and evidence based medicine.  Patient/parent/caregiver has been advised that the patient may require follow up for further testing and treatment if the symptoms continue in spite of treatment, as clinically indicated and appropriate.  Patient/parent/caregiver has been advised to return to the [facility] or PCP if no better; to PCP or the Emergency Department if new signs and symptoms develop, or if the current signs or symptoms continue to change or worsen for further workup, evaluation and treatment as clinically  indicated and appropriate  The patient will follow up with their current PCP if and as advised. If the patient does not currently have a PCP we will assist them in obtaining one.   The patient may need specialty follow up if the symptoms continue, in spite of conservative treatment and management, for further workup, evaluation, consultation and treatment as clinically indicated and appropriate.   Patient/parent/caregiver verbalized understanding and agreement of plan as discussed.  All questions were addressed during visit.  Please see discharge instructions below for further details of plan.  This office note has been dictated using Teaching laboratory technician.  Unfortunately, this method of dictation can sometimes lead to typographical or grammatical errors.  I apologize for your inconvenience in advance if this occurs.  Please do not hesitate to reach out to me if clarification is needed.      Theadora Rama Scales, PA-C 05/05/22 1606

## 2022-05-05 NOTE — Discharge Instructions (Addendum)
For treatment of gout, you are provided with an injection of steroids during your visit today.  Tomorrow morning, please begin taking prednisone 40 mg (four tablets) daily with your morning meal until the acute flare has mostly resolved.  Once this occurs, begin taking the lower doses of prednisone exactly as prescribed.    Your prednisone prescription will provide you with enough prednisone tablets for a 21 day treatment but because you will likely not need to take the 40 mg dose for the full seven days as I have provided so you will more than likely have several tablets left over.    I have enclosed a list of foods to avoid to reduce your gout flares.  Also as we discussed, if you suspect that you are beginning to have a gout flare, stop taking allopurinol immediately and reach out to your PCP or Urgent Care for acute gout treatment.   The result of your blood tests will be posted to your mychart account as soon as they are complete, a few days typically.  If there are any abnormal findings that require intervention, you will be contacted by phone.   Thank you for visiting urgent care today.

## 2022-05-05 NOTE — ED Triage Notes (Signed)
Pt c/o gout to top of both feet since Sunday. States had a beer on Friday. Hx of same .

## 2022-07-05 ENCOUNTER — Encounter (HOSPITAL_COMMUNITY): Payer: Self-pay

## 2022-07-05 ENCOUNTER — Ambulatory Visit (HOSPITAL_COMMUNITY)
Admission: EM | Admit: 2022-07-05 | Discharge: 2022-07-05 | Disposition: A | Discharge status: Home / Self Care | Payer: Self-pay | Attending: Emergency Medicine | Admitting: Emergency Medicine

## 2022-07-05 DIAGNOSIS — M10072 Idiopathic gout, left ankle and foot: Secondary | ICD-10-CM

## 2022-07-05 MED ORDER — PREDNISONE 10 MG (21) PO TBPK: ORAL_TABLET | Freq: Every day | ORAL | 0 refills | Status: DC

## 2022-07-05 NOTE — ED Triage Notes (Signed)
Patient having pain in the left foot for 3 days. States he was diagnose with gout. Pain is in the ball of the great left toe.  Patient has taken ibuprofen with no relief.

## 2022-07-05 NOTE — ED Provider Notes (Signed)
[facility]    CSN: [id number] Arrival date & time: 07/05/22  1217      History   Chief Complaint Chief Complaint  Patient presents with   Gout    HPI TRAJAN GROVE is a 36 y.o. male.   Patient presents with erythema, pain and swelling of the foot near the third fourth and fifth left toe.  Symptoms started 4 to 5 days ago after consumption of beer.  Has been painful to bear weight.  Range of motion of the toes are intact.  Denies numbness, tingling, injury or trauma.  Has attempted use of ice, warm soaks and ibuprofen which have been minimally helpful.  History of gout.     Past Medical History:  Diagnosis Date   Asthma    ETOH abuse     There are no problems to display for this patient.   History reviewed. No pertinent surgical history.     Home Medications    Prior to Admission medications   Not on File    Family History History reviewed. No pertinent family history.  Social History Social History   Tobacco Use   Smoking status: Never   Smokeless tobacco: Never  Substance Use Topics   Alcohol use: Yes    Alcohol/week: 7.0 standard drinks of alcohol    Types: 5 Cans of beer, 2 Shots of liquor per week   Drug use: Not Currently    Types: Marijuana, Cocaine     Allergies   Patient has no known allergies.   Review of Systems Review of Systems  Constitutional: Negative.   HENT: Negative.    Respiratory: Negative.    Cardiovascular: Negative.   Musculoskeletal:  Positive for joint swelling. Negative for arthralgias, back pain, gait problem, myalgias, neck pain and neck stiffness.  Neurological: Negative.      Physical Exam Triage Vital Signs ED Triage Vitals  Enc Vitals Group     BP 07/05/22 1327 127/80     Pulse Rate 07/05/22 1327 84     Resp 07/05/22 1327 16     Temp 07/05/22 1327 98.4 F (36.9 C)     Temp Source 07/05/22 1327 Oral     SpO2 07/05/22 1327 96 %     Weight --      Height --      Head Circumference --       Peak Flow --      Pain Score 07/05/22 1333 10     Pain Loc --      Pain Edu? --      Excl. in GC? --    No data found.  Updated Vital Signs BP 127/80 (BP Location: Right Arm)   Pulse 84   Temp 98.4 F (36.9 C) (Oral)   Resp 16   SpO2 96%   Visual Acuity Right Eye Distance:   Left Eye Distance:   Bilateral Distance:    Right Eye Near:   Left Eye Near:    Bilateral Near:     Physical Exam Constitutional:      Appearance: Normal appearance.  HENT:     Head: Normocephalic.  Eyes:     Extraocular Movements: Extraocular movements intact.  Pulmonary:     Effort: Pulmonary effort is normal.  Feet:     Comments: Erythema, tenderness and mild to moderate swelling is present to the base of the third fourth and fifth metatarsal, able to bear weight, range of motion intact, sensation intact, capillary refill less than  3 Neurological:     Mental Status: He is alert and oriented to person, place, and time. Mental status is at baseline.  Psychiatric:        Mood and Affect: Mood normal.        Behavior: Behavior normal.      UC Treatments / Results  Labs (all labs ordered are listed, but only abnormal results are displayed) Labs Reviewed - No data to display  EKG   Radiology No results found.  Procedures Procedures (including critical care time)  Medications Ordered in UC Medications - No data to display  Initial Impression / Assessment and Plan / UC Course  I have reviewed the triage vital signs and the nursing notes.  Pertinent labs & imaging results that were available during my care of the patient were reviewed by me and considered in my medical decision making (see chart for details).  Acute idiopathic gout involving toe of left foot  Presentation is consistent with gout and with history we will start treatment, prednisone taper prescribed as patient has had success with this medication in the past, recommended additional supportive measures of ice and  heat, elevation and dietary restrictions, given referral to podiatry if symptoms persist and worsen as he has no PCP, may follow-up with urgent care as needed and work note given Final Clinical Impressions(s) / UC Diagnoses   Final diagnoses:  None   Discharge Instructions   None    ED Prescriptions   None    PDMP not reviewed this encounter.   Hans Eden, NP 07/05/22 1355

## 2022-07-05 NOTE — Discharge Instructions (Addendum)
Today you are being treated for your gout symptoms  Begin prednisone every morning as directed, this medicine reduces inflammation which in turn will help with your pain, you may in addition to this as needed  You icing the area in 10 to 15-minute intervals, may also use heat or warm soaks if helpful  May elevate your wrist foot when sitting and lying which helps to reduce swelling  You may follow-up with urgent care as needed if symptoms persist or worsen  You have been given information to podiatry which is the foot specialist if you would like further evaluation of your symptoms, there is a daily maintenance medicine that may be used for gout that will need to be prescribed by primary physician or podiatrist

## 2022-11-15 ENCOUNTER — Encounter (HOSPITAL_COMMUNITY): Payer: Self-pay | Admitting: *Deleted

## 2022-11-15 ENCOUNTER — Ambulatory Visit (INDEPENDENT_AMBULATORY_CARE_PROVIDER_SITE_OTHER): Payer: Self-pay

## 2022-11-15 ENCOUNTER — Other Ambulatory Visit: Payer: Self-pay

## 2022-11-15 ENCOUNTER — Ambulatory Visit (HOSPITAL_COMMUNITY)
Admission: EM | Admit: 2022-11-15 | Discharge: 2022-11-15 | Disposition: A | Discharge status: Home / Self Care | Payer: Self-pay | Attending: Family Medicine | Admitting: Family Medicine

## 2022-11-15 DIAGNOSIS — M25561 Pain in right knee: Secondary | ICD-10-CM

## 2022-11-15 MED ORDER — PREDNISONE 20 MG PO TABS: 40.0000 mg | ORAL_TABLET | Freq: Every day | ORAL | 0 refills | Status: AC

## 2022-11-15 NOTE — ED Triage Notes (Signed)
Pt reports Rt knee pain with out injury fo rone week.

## 2022-11-15 NOTE — Discharge Instructions (Signed)
Your x-ray showed some possible swelling in the patellar tendon on the front of the knee, so that may be inflamed.  No bony abnormalities.  Take prednisone 20 mg--2 daily for 5 days  Continue taking the Tylenol arthritis, as that we will mix well with the prednisone.

## 2022-11-15 NOTE — ED Provider Notes (Signed)
[facility]    CSN: [id number] Arrival date & time: 11/15/22  [id number]      History   Chief Complaint Chief Complaint  Patient presents with   Knee Pain    HPI John Meadows is a 37 y.o. male.    Knee Pain  Here for right knee pain that's been bothering him for about 1 week.  No trauma noted.  He has not felt a pop.  Tylenol arthritis is helping some.  He does have a history of gout but this is somewhat different, and usually that affects him in his feet or knees.  He has been exercising on an exercise bike some.  Past Medical History:  Diagnosis Date   Asthma    ETOH abuse     There are no problems to display for this patient.   History reviewed. No pertinent surgical history.     Home Medications    Prior to Admission medications   Medication Sig Start Date End Date Taking? Authorizing Provider  predniSONE (DELTASONE) 20 MG tablet Take 2 tablets (40 mg total) by mouth daily with breakfast for 5 days. 11/15/22 11/20/22 Yes Barrett Henle, MD    Family History History reviewed. No pertinent family history.  Social History Social History   Tobacco Use   Smoking status: Never   Smokeless tobacco: Never  Substance Use Topics   Alcohol use: Yes    Alcohol/week: 7.0 standard drinks of alcohol    Types: 5 Cans of beer, 2 Shots of liquor per week   Drug use: Not Currently    Types: Marijuana, Cocaine     Allergies   Patient has no known allergies.   Review of Systems Review of Systems   Physical Exam Triage Vital Signs ED Triage Vitals  Enc Vitals Group     BP 11/15/22 0817 129/82     Pulse Rate 11/15/22 0817 73     Resp 11/15/22 0817 18     Temp 11/15/22 0817 98.1 F (36.7 C)     Temp src --      SpO2 11/15/22 0817 98 %     Weight --      Height --      Head Circumference --      Peak Flow --      Pain Score 11/15/22 0815 10     Pain Loc --      Pain Edu? --      Excl. in [place]? --    No data found.  Updated Vital  Signs BP 129/82   Pulse 73   Temp 98.1 F (36.7 C)   Resp 18   SpO2 98%   Visual Acuity Right Eye Distance:   Left Eye Distance:   Bilateral Distance:    Right Eye Near:   Left Eye Near:    Bilateral Near:     Physical Exam Vitals reviewed.  Constitutional:      General: He is not in acute distress.    Appearance: He is not ill-appearing, toxic-appearing or diaphoretic.  HENT:     Mouth/Throat:     Mouth: Mucous membranes are moist.     Pharynx: No oropharyngeal exudate or posterior oropharyngeal erythema.  Eyes:     Extraocular Movements: Extraocular movements intact.     Pupils: Pupils are equal, round, and reactive to light.  Musculoskeletal:     Comments: There is maybe a tiny effusion of the right knee.  No deformity.  There is a  little joint line tenderness on the medial side.  No erythema or rash.  Skin:    Coloration: Skin is not jaundiced or pale.  Neurological:     General: No focal deficit present.     Mental Status: He is alert and oriented to person, place, and time.  Psychiatric:        Behavior: Behavior normal.      UC Treatments / Results  Labs (all labs ordered are listed, but only abnormal results are displayed) Labs Reviewed - No data to display  EKG   Radiology DG Knee 2 Views Right  Result Date: 11/15/2022 CLINICAL DATA:  Right knee pain for 1 week.  No known injury EXAM: RIGHT KNEE - 1-2 VIEW COMPARISON:  06/14/2016 FINDINGS: No evidence of fracture, dislocation, or joint effusion. Joint spaces are preserved. Chronic calcification adjacent to the tibial tuberosity. Thickened appearance of the patellar tendon shadow. Infrapatellar soft tissue swelling. IMPRESSION: 1. No acute fracture or dislocation. 2. Thickened appearance of the patellar tendon shadow with infrapatellar soft tissue swelling. Findings may reflect patellar tendinopathy. Electronically Signed   By: Davina Poke D.O.   On: 11/15/2022 09:02    Procedures Procedures  (including critical care time)  Medications Ordered in UC Medications - No data to display  Initial Impression / Assessment and Plan / UC Course  I have reviewed the triage vital signs and the nursing notes.  Pertinent labs & imaging results that were available during my care of the patient were reviewed by me and considered in my medical decision making (see chart for details).       X-ray shows some mild arthritic changes and calcification in the patellar tendon.  Radiology also sees some swelling of the patellar tendon. Prednisone is sent in for 5 days for inflammation in the knee and in the patellar tendon.  He is going to continue wearing the knee sleeve brace he has.  He will continue Tylenol as needed.  Contact information is given for orthopedics. Final Clinical Impressions(s) / UC Diagnoses   Final diagnoses:  Acute pain of right knee     Discharge Instructions      Your x-ray showed some possible swelling in the patellar tendon on the front of the knee, so that may be inflamed.  No bony abnormalities.  Take prednisone 20 mg--2 daily for 5 days  Continue taking the Tylenol arthritis, as that we will mix well with the prednisone.       ED Prescriptions     Medication Sig Dispense Auth. Provider   predniSONE (DELTASONE) 20 MG tablet Take 2 tablets (40 mg total) by mouth daily with breakfast for 5 days. 10 tablet Windy Carina Gwenlyn Perking, MD      I have reviewed the PDMP during this encounter.   Barrett Henle, MD 11/15/22 [phone]

## 2023-10-28 ENCOUNTER — Encounter (HOSPITAL_COMMUNITY): Payer: Self-pay

## 2023-10-28 ENCOUNTER — Ambulatory Visit (HOSPITAL_COMMUNITY)
Admission: EM | Admit: 2023-10-28 | Discharge: 2023-10-28 | Disposition: A | Discharge status: Home / Self Care | Payer: Self-pay | Attending: Emergency Medicine | Admitting: Emergency Medicine

## 2023-10-28 DIAGNOSIS — M25532 Pain in left wrist: Secondary | ICD-10-CM

## 2023-10-28 DIAGNOSIS — M25432 Effusion, left wrist: Secondary | ICD-10-CM

## 2023-10-28 MED ORDER — METHYLPREDNISOLONE 4 MG PO TBPK: ORAL_TABLET | ORAL | 0 refills | Status: DC

## 2023-10-28 NOTE — ED Provider Notes (Addendum)
[facility]    CSN: [id number] Arrival date & time: 10/28/23  1324      History   Chief Complaint Chief Complaint  Patient presents with   Wrist Pain    HPI John Meadows is a 38 y.o. male.   Patient presents with left wrist pain and swelling x 3 days.  Denies any known injury.   Wrist Pain    Past Medical History:  Diagnosis Date   Asthma    ETOH abuse     There are no active problems to display for this patient.   History reviewed. No pertinent surgical history.     Home Medications    Prior to Admission medications   Medication Sig Start Date End Date Taking? Authorizing Provider  methylPREDNISolone (MEDROL DOSEPAK) 4 MG TBPK tablet Take as instructed on the box. 10/28/23  Yes Letta Kocher, NP    Family History History reviewed. No pertinent family history.  Social History Social History   Tobacco Use   Smoking status: Never   Smokeless tobacco: Never  Substance Use Topics   Alcohol use: Yes    Alcohol/week: 7.0 standard drinks of alcohol    Types: 5 Cans of beer, 2 Shots of liquor per week   Drug use: Not Currently    Types: Marijuana, Cocaine     Allergies   Patient has no known allergies.   Review of Systems Review of Systems  Musculoskeletal:  Positive for arthralgias.     Physical Exam Triage Vital Signs ED Triage Vitals [10/28/23 1026]  Encounter Vitals Group     BP (!) 140/85     Systolic BP Percentile      Diastolic BP Percentile      Pulse Rate 88     Resp 16     Temp 98.3 F (36.8 C)     Temp Source Oral     SpO2 98 %     Weight      Height      Head Circumference      Peak Flow      Pain Score      Pain Loc      Pain Education      Exclude from Growth Chart    No data found.  Updated Vital Signs BP (!) 140/85 (BP Location: Left Arm)   Pulse 88   Temp 98.3 F (36.8 C) (Oral)   Resp 16   SpO2 98%   Visual Acuity Right Eye Distance:   Left Eye Distance:   Bilateral Distance:     Right Eye Near:   Left Eye Near:    Bilateral Near:     Physical Exam Vitals and nursing note reviewed.  Constitutional:      General: He is awake. He is not in acute distress.    Appearance: Normal appearance. He is well-developed and well-groomed. He is not ill-appearing.  Musculoskeletal:     Right wrist: Normal.     Left wrist: Swelling and tenderness present. No bony tenderness. Decreased range of motion.     Right hand: Normal.     Left hand: Swelling present. No tenderness. Normal range of motion.     Comments: Mild swelling noted to left wrist with tenderness and slightly decreased range of motion.  Mild swelling also noted to left hand.  Neurological:     Mental Status: He is alert.  Psychiatric:        Behavior: Behavior is cooperative.  UC Treatments / Results  Labs (all labs ordered are listed, but only abnormal results are displayed) Labs Reviewed - No data to display  EKG   Radiology No results found.  Procedures Procedures (including critical care time)  Medications Ordered in UC Medications - No data to display  Initial Impression / Assessment and Plan / UC Course  I have reviewed the triage vital signs and the nursing notes.  Pertinent labs & imaging results that were available during my care of the patient were reviewed by me and considered in my medical decision making (see chart for details).     Patient presented with 3-day history of left wrist pain and swelling.  Denies any known injury.  Upon assessment mild swelling noted to left wrist with tenderness and slightly decreased range of motion in all directions.  Mild swelling also noted to left hand.  Denies pain with passive range of motion.  Given Medrol Dosepak to help with inflammation.  Recommended Tylenol and ibuprofen as needed for pain.  Recommend that he continue wearing wrist brace as needed.  Discussed follow-up and return precautions. Final Clinical Impressions(s) / UC  Diagnoses   Final diagnoses:  Wrist pain, acute, left  Wrist swelling, left     Discharge Instructions      Take Medrol Dosepak as instructed on the box. Continue wearing brace and taking Tylenol and Ibuprofen as needed for pain. Return here or follow-up with [facility] if symptoms persist.     ED Prescriptions     Medication Sig Dispense Auth. Provider   methylPREDNISolone (MEDROL DOSEPAK) 4 MG TBPK tablet Take as instructed on the box. 1 each Letta Kocher, NP      PDMP not reviewed this encounter.   Letta Kocher, NP 10/28/23 1509    Wynonia Lawman A, NP 10/28/23 1510

## 2023-10-28 NOTE — ED Triage Notes (Signed)
Patient presents to the office for right wrist pain x 3 days. 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

## 2023-10-28 NOTE — Discharge Instructions (Signed)
Take Medrol Dosepak as instructed on the box. Continue wearing brace and taking Tylenol and Ibuprofen as needed for pain. Return here or follow-up with [facility] if symptoms persist.

## 2024-05-02 ENCOUNTER — Ambulatory Visit (HOSPITAL_COMMUNITY)
Admission: EM | Admit: 2024-05-02 | Discharge: 2024-05-02 | Disposition: A | Discharge status: Home / Self Care | Payer: Self-pay

## 2024-05-02 ENCOUNTER — Encounter (HOSPITAL_COMMUNITY): Payer: Self-pay | Admitting: Emergency Medicine

## 2024-05-02 ENCOUNTER — Other Ambulatory Visit: Payer: Self-pay

## 2024-05-02 DIAGNOSIS — Z8739 Personal history of other diseases of the musculoskeletal system and connective tissue: Secondary | ICD-10-CM

## 2024-05-02 DIAGNOSIS — M79671 Pain in right foot: Secondary | ICD-10-CM

## 2024-05-02 HISTORY — DX: Gout, unspecified: M10.9

## 2024-05-02 MED ORDER — PREDNISONE 10 MG PO TABS: ORAL_TABLET | ORAL | 0 refills | Status: AC

## 2024-05-02 NOTE — Discharge Instructions (Addendum)
 We have sent in prednisone  to help with your symptoms.  We recommend following up with your primary care provider if symptoms are not improving.  Please return or go to the emergency department if you develop any fever, loss of sensation, significant worsening of symptoms, or if you have any other concerns.  See the steroid dosing regimen below.  On days 1 through 3, take 40 mg (4 tabs).  On days 4 through 6, take 30 mg (3 tabs).  On days 7 through 9, take 20 mg (2 tabs).

## 2024-05-02 NOTE — ED Provider Notes (Signed)
 [facility]    CSN: [id number] Arrival date & time: 05/02/24  1208      History   Chief Complaint Chief Complaint  Patient presents with   Foot Pain    HPI John Meadows is a 38 y.o. male.   Patient is a 38 year old male who presents to the urgent care today with concerns of possible gout flare.  He reports having pain of the right foot for about the past week.  He reports that his last gout flare was about 2 years ago and was given prednisone  and it went away.  He took some naproxen  which helped initially but did not fully clear up his symptoms.  He is unsure if this gout flare is due to something he ate or drank.  He denies any other symptoms including fever, loss of sensation, swelling of the leg, pain out of proportion, or other concerns at this time.    Past Medical History:  Diagnosis Date   Asthma    ETOH abuse    Gout     There are no active problems to display for this patient.   History reviewed. No pertinent surgical history.     Home Medications    Prior to Admission medications   Medication Sig Start Date End Date Taking? Authorizing Provider  predniSONE  (DELTASONE ) 10 MG tablet On days 1 through 3, take 40 mg (4 tabs).  On days 4 through 6, take 30 mg (3 tabs).  On days 7 through 9, take 20 mg (2 tabs). 05/02/24  Yes Melonie Locus, PA-C    Family History History reviewed. No pertinent family history.  Social History Social History   Tobacco Use   Smoking status: Never   Smokeless tobacco: Never  Vaping Use   Vaping status: Never Used  Substance Use Topics   Alcohol use: Yes    Alcohol/week: 7.0 standard drinks of alcohol    Types: 5 Cans of beer, 2 Shots of liquor per week   Drug use: Not Currently    Types: Marijuana, Cocaine     Allergies   Patient has no known allergies.   Review of Systems Review of Systems See HPI for relevant ROS.  Physical Exam Triage Vital Signs ED Triage Vitals  Encounter Vitals Group      BP 05/02/24 1230 135/79     Girls Systolic BP Percentile --      Girls Diastolic BP Percentile --      Boys Systolic BP Percentile --      Boys Diastolic BP Percentile --      Pulse Rate 05/02/24 1230 66     Resp 05/02/24 1230 18     Temp 05/02/24 1230 98.1 F (36.7 C)     Temp Source 05/02/24 1230 Oral     SpO2 05/02/24 1230 99 %     Weight --      Height --      Head Circumference --      Peak Flow --      Pain Score 05/02/24 1226 10     Pain Loc --      Pain Education --      Exclude from Growth Chart --    No data found.  Updated Vital Signs BP 135/79 (BP Location: Left Arm)   Pulse 66   Temp 98.1 F (36.7 C) (Oral)   Resp 18   SpO2 99%   Visual Acuity Right Eye Distance:   Left Eye Distance:  Bilateral Distance:    Right Eye Near:   Left Eye Near:    Bilateral Near:     Physical Exam General: Alert and oriented, well-developed/well-nourished, calm, cooperative, no acute distress HEENT: Normocephalic atraumatic, moist mucous membranes, no scleral icterus, trachea midline Lungs: Speaking full sentences, non-labored respirations, no distress Heart: Regular rate and rhythm Abdomen:  Soft, nondistended Musculoskeletal: Moves all extremities well, mild edema and pain to palpation of the right MCP Pulses: 2+ pedal bilaterally Neurologic: Awake, A&O x4, full sensation to light touch in the lower extremities Integumentary: Warm, dry, normal for ethnicity, intact Psychiatric: Appropriate mood & affect  UC Treatments / Results  Labs (all labs ordered are listed, but only abnormal results are displayed) Labs Reviewed - No data to display  EKG   Radiology No results found.  Procedures Procedures (including critical care time)  Medications Ordered in UC Medications - No data to display  Initial Impression / Assessment and Plan / UC Course  I have reviewed the triage vital signs and the nursing notes.  Pertinent labs & imaging results that were  available during my care of the patient were reviewed by me and considered in my medical decision making (see chart for details).    Presents with right foot pain.  Differential diagnosis includes: Sprain, fracture, dislocation, contusion, hematoma, septic arthritis, gout, including other diagnoses.  History obtained from: Patient.  Plan: Patient presents with right foot pain around the MCP.  Patient does have a history of gout and reports that this feels the same as his gout flares in the past.  Patient is neurovascularly intact.  Prescribed patient prednisone  taper for treatment of gout.  Discussed over-the-counter options patient can use for symptomatic improvement.  Recommended patient follow-up with his primary care provider in the next several days if symptoms are not improving.  Discussed return precautions to the urgent care emergency department including loss of sensation, worsening symptoms, fever, pain out of proportion, or if he has any other concerns.  Disposition: Stable to discharge home.   All questions answered to the best of this examiner's ability. Advised to f/u with PCP for further eval and/or reassessment. Patient agrees to plan.  An appropriate evaluation has been performed, and in my medical judgment there is currently no evidence of an immediate life-threatening or surgical condition. Discharge is therefore indicated at this time.  This document was created using the aid of voice recognition Scientist, clinical (histocompatibility and immunogenetics).  Final Clinical Impressions(s) / UC Diagnoses   Final diagnoses:  Foot pain, right  History of gout     Discharge Instructions      We have sent in prednisone  to help with your symptoms.  We recommend following up with your primary care provider if symptoms are not improving.  Please return or go to the emergency department if you develop any fever, loss of sensation, significant worsening of symptoms, or if you have any other concerns.  See the  steroid dosing regimen below.  On days 1 through 3, take 40 mg (4 tabs).  On days 4 through 6, take 30 mg (3 tabs).  On days 7 through 9, take 20 mg (2 tabs).    ED Prescriptions     Medication Sig Dispense Auth. Provider   predniSONE  (DELTASONE ) 10 MG tablet On days 1 through 3, take 40 mg (4 tabs).  On days 4 through 6, take 30 mg (3 tabs).  On days 7 through 9, take 20 mg (2 tabs). 27 tablet Melonie Locus,  PA-C      PDMP not reviewed this encounter.   Melonie Locus, PA-C 05/02/24 1252

## 2024-05-02 NOTE — ED Triage Notes (Signed)
 Patient has noticed pain and swelling for a week.  History of gout.  Patient has taken naproxen .
